# Patient Record
Sex: Female | Born: 1975 | Race: Black or African American | Hispanic: No | Smoking: Never smoker
Health system: Southern US, Community
[De-identification: ages and names within clinical notes are randomized; demographics above are authoritative.]

## PROBLEM LIST (undated history)

## (undated) DIAGNOSIS — D649 Anemia, unspecified: Secondary | ICD-10-CM

## (undated) DIAGNOSIS — R413 Other amnesia: Secondary | ICD-10-CM

## (undated) DIAGNOSIS — Z8679 Personal history of other diseases of the circulatory system: Secondary | ICD-10-CM

## (undated) DIAGNOSIS — I499 Cardiac arrhythmia, unspecified: Secondary | ICD-10-CM

## (undated) DIAGNOSIS — G43909 Migraine, unspecified, not intractable, without status migrainosus: Secondary | ICD-10-CM

## (undated) DIAGNOSIS — Z9109 Other allergy status, other than to drugs and biological substances: Secondary | ICD-10-CM

## (undated) DIAGNOSIS — Z9289 Personal history of other medical treatment: Secondary | ICD-10-CM

## (undated) DIAGNOSIS — R011 Cardiac murmur, unspecified: Secondary | ICD-10-CM

## (undated) HISTORY — DX: Morbid (severe) obesity due to excess calories: E66.01

## (undated) HISTORY — DX: Other amnesia: R41.3

## (undated) HISTORY — DX: Migraine, unspecified, not intractable, without status migrainosus: G43.909

## (undated) HISTORY — DX: Cardiac murmur, unspecified: R01.1

## (undated) HISTORY — DX: Personal history of other diseases of the circulatory system: Z86.79

---

## 2002-10-14 ENCOUNTER — Inpatient Hospital Stay (HOSPITAL_COMMUNITY): Admission: EM | Admit: 2002-10-14 | Discharge: 2002-10-16 | Payer: Self-pay | Admitting: Emergency Medicine

## 2002-10-14 ENCOUNTER — Emergency Department (HOSPITAL_COMMUNITY): Admission: EM | Admit: 2002-10-14 | Discharge: 2002-10-14 | Payer: Self-pay | Admitting: Emergency Medicine

## 2002-10-14 ENCOUNTER — Encounter (INDEPENDENT_AMBULATORY_CARE_PROVIDER_SITE_OTHER): Payer: Self-pay | Admitting: Cardiology

## 2002-10-14 ENCOUNTER — Encounter: Payer: Self-pay | Admitting: Emergency Medicine

## 2002-10-15 ENCOUNTER — Encounter: Payer: Self-pay | Admitting: Cardiology

## 2002-10-27 ENCOUNTER — Inpatient Hospital Stay (HOSPITAL_COMMUNITY): Admission: EM | Admit: 2002-10-27 | Discharge: 2002-11-10 | Payer: Self-pay

## 2002-10-27 ENCOUNTER — Encounter: Payer: Self-pay | Admitting: Cardiology

## 2002-10-28 HISTORY — PX: CYSTECTOMY: SHX5119

## 2002-10-31 ENCOUNTER — Encounter: Payer: Self-pay | Admitting: Thoracic Surgery

## 2002-11-01 ENCOUNTER — Encounter: Payer: Self-pay | Admitting: Cardiology

## 2002-11-01 ENCOUNTER — Encounter (INDEPENDENT_AMBULATORY_CARE_PROVIDER_SITE_OTHER): Payer: Self-pay | Admitting: *Deleted

## 2002-11-01 ENCOUNTER — Encounter: Payer: Self-pay | Admitting: Thoracic Surgery

## 2002-11-02 ENCOUNTER — Encounter: Payer: Self-pay | Admitting: Thoracic Surgery

## 2002-11-03 ENCOUNTER — Encounter: Payer: Self-pay | Admitting: Thoracic Surgery

## 2002-11-04 ENCOUNTER — Encounter: Payer: Self-pay | Admitting: Thoracic Surgery

## 2002-11-05 ENCOUNTER — Encounter: Payer: Self-pay | Admitting: Thoracic Surgery

## 2002-11-06 ENCOUNTER — Encounter: Payer: Self-pay | Admitting: Thoracic Surgery

## 2002-11-10 ENCOUNTER — Encounter: Payer: Self-pay | Admitting: Thoracic Surgery

## 2002-11-16 ENCOUNTER — Encounter: Admission: RE | Admit: 2002-11-16 | Discharge: 2002-11-16 | Payer: Self-pay | Admitting: Thoracic Surgery

## 2002-11-16 ENCOUNTER — Encounter: Payer: Self-pay | Admitting: Thoracic Surgery

## 2003-01-06 ENCOUNTER — Encounter: Admission: RE | Admit: 2003-01-06 | Discharge: 2003-01-06 | Payer: Self-pay | Admitting: Thoracic Surgery

## 2003-01-06 ENCOUNTER — Encounter: Payer: Self-pay | Admitting: Thoracic Surgery

## 2005-01-31 ENCOUNTER — Other Ambulatory Visit: Admission: RE | Admit: 2005-01-31 | Discharge: 2005-01-31 | Payer: Self-pay | Admitting: Family Medicine

## 2005-01-31 ENCOUNTER — Ambulatory Visit: Payer: Self-pay | Admitting: Family Medicine

## 2005-04-15 ENCOUNTER — Ambulatory Visit (HOSPITAL_COMMUNITY): Admission: RE | Admit: 2005-04-15 | Discharge: 2005-04-15 | Payer: Self-pay | Admitting: Obstetrics and Gynecology

## 2006-01-07 ENCOUNTER — Inpatient Hospital Stay (HOSPITAL_COMMUNITY): Admission: AD | Admit: 2006-01-07 | Discharge: 2006-01-10 | Payer: Self-pay | Admitting: Obstetrics and Gynecology

## 2006-01-07 ENCOUNTER — Encounter (INDEPENDENT_AMBULATORY_CARE_PROVIDER_SITE_OTHER): Payer: Self-pay | Admitting: *Deleted

## 2006-10-28 HISTORY — PX: DILATION AND CURETTAGE OF UTERUS: SHX78

## 2007-01-16 ENCOUNTER — Emergency Department (HOSPITAL_COMMUNITY): Admission: EM | Admit: 2007-01-16 | Discharge: 2007-01-16 | Payer: Self-pay | Admitting: Family Medicine

## 2007-08-30 ENCOUNTER — Inpatient Hospital Stay (HOSPITAL_COMMUNITY): Admission: AD | Admit: 2007-08-30 | Discharge: 2007-08-30 | Payer: Self-pay | Admitting: Obstetrics

## 2007-09-01 ENCOUNTER — Encounter (INDEPENDENT_AMBULATORY_CARE_PROVIDER_SITE_OTHER): Payer: Self-pay | Admitting: Obstetrics and Gynecology

## 2007-09-01 ENCOUNTER — Ambulatory Visit (HOSPITAL_COMMUNITY): Admission: RE | Admit: 2007-09-01 | Discharge: 2007-09-01 | Payer: Self-pay | Admitting: Obstetrics and Gynecology

## 2008-09-28 ENCOUNTER — Observation Stay (HOSPITAL_COMMUNITY): Admission: EM | Admit: 2008-09-28 | Discharge: 2008-09-29 | Payer: Self-pay | Admitting: Emergency Medicine

## 2008-09-29 ENCOUNTER — Encounter (INDEPENDENT_AMBULATORY_CARE_PROVIDER_SITE_OTHER): Payer: Self-pay | Admitting: Internal Medicine

## 2009-08-07 ENCOUNTER — Encounter: Admission: RE | Admit: 2009-08-07 | Discharge: 2009-08-07 | Payer: Self-pay | Admitting: Obstetrics and Gynecology

## 2010-11-18 ENCOUNTER — Encounter: Payer: Self-pay | Admitting: Surgery

## 2011-03-12 NOTE — Op Note (Signed)
[name], [name]            ACCOUNT NO.:  [id number]   MEDICAL RECORD NO.:  [id number]          PATIENT TYPE:  AMB   LOCATION:  SDC                           FACILITY:  [facility]   PHYSICIAN:  Lenoard Aden, M.D.DATE OF BIRTH:  12/31/1975   DATE OF PROCEDURE:  09/01/2007  DATE OF DISCHARGE:                               OPERATIVE REPORT   PREOPERATIVE DIAGNOSIS:  Missed abortion.   POSTOPERATIVE DIAGNOSIS:  Missed abortion.   PROCEDURE:  Suction dilatation and evacuation.   SURGEON:  Lenoard Aden, M.D.   ANESTHESIA:  MAC and paracervical.   ESTIMATED BLOOD LOSS:  Less than 50 mL.   COMPLICATIONS:  None.   DRAINS:  None.   COUNTS:  Correct.   The patient went to the recovery room in good condition.   Products of conception to pathology.   BRIEF OPERATIVE NOTE:  After being apprised of the risks of anesthesia,  infection, bleeding, possible risk of uterine perforation with possible  need for repair, injury to bowel and bladder with possible need for  repair, the patient was brought to the operating room, where she was  administered IV sedation without difficulty, prepped and draped in the  sterile fashion, catheterized until the bladder was empty.  After  achieving adequate anesthesia, dilute Xylocaine solution placed in a  standard paracervical block, 20 mL total.  The cervix is easily dilated  up to a #23 Pratt dilator, 7-mm suction curette placed.  Suction is  initiated, revealing products of conception.  Repeat curettage and  suction curettage done in a four-quadrant method reveals the cavity to  be empty.  Repeat suction confirms, good hemostasis noted.  All  instruments removed.  The patient tolerates the procedure well and is  transferred to recovery in good condition.      Lenoard Aden, M.D.  Electronically Signed    RJT/MEDQ  D:  09/01/2007  T:  09/02/2007  Job:  [id number]

## 2011-03-12 NOTE — H&P (Signed)
[name], [name]            ACCOUNT NO.:  [id number]   MEDICAL RECORD NO.:  [id number]           PATIENT TYPE:   LOCATION:                                 FACILITY:   PHYSICIAN:  [name], M.D.DATE OF BIRTH:  December 15, 1975   DATE OF ADMISSION:  09/28/2008  DATE OF DISCHARGE:                              HISTORY & PHYSICAL   The patient's PCP is Dr. Jeanmarie Hubert.   CHIEF COMPLAINT:  Chest pain.   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  She described it as a mild pressure in her chest.  Then, for the last  several days, it has been continuous to the point where she could not  take anymore, and she came into the emergency room for further  evaluation.  In the emergency room, the patient was given a dose of  nitroglycerin and some aspirin.  She said the nitroglycerin briefly  seemed to improve her pain but then returned back.  She described it as  currently a mild 2/10.  She says it is worse when she lies on her back,  and when she leans forward, it seems to improve it slightly.  She feels  that this pain is similar to when she had her previous cystic growth.  Because of her previous history, a CT scan of the chest with contrast  was done after a negative chest x-ray.  The CT scan of chest noted  surgical changes involving 6th right posterior rib from previous  thoracotomy, no obvious masses, but essentially unremarkable chest CT;  no signs of any spread of the cystic growth.   Currently, the patient is doing well.  She denies any headaches, vision  changes, dysphagia.  No palpitations, shortness of breath, wheezing, or  coughing.  No abdominal pain.  No  hematuria, dysuria, constipation,  diarrhea, focal extremity numbness, weakness, or pain.  Chest pain is as  described above.  Review of systems otherwise negative.   The patient's past medical history includes a history of benign cystic  growth leading to secondary atrial fibrillation, now resolved status  post thoracotomy.   MEDICATION:  She is on:  1. Multivitamin daily.  2. Phentermine as needed  3. P.r.n. antacids.   SHE HAS ALLERGIES TO TOBRAMYCIN EYE DROPS.   SOCIAL HISTORY:  No tobacco, alcohol or drug use.   FAMILY HISTORY:  Noted for mom who underwent bypass surgery in her 33s.   PHYSICAL EXAMINATION:  VITALS:  On admission, temperature 98.9, heart  rate 97 - now down to 91, blood pressure 148/93 - now down to 137/74,  respirations 16, O2 saturation 100% on 2 liters.  GENERAL:  She is alert and oriented x3 in no apparent distress.  HEENT:  Normocephalic, atraumatic.  Mucous membranes are moist.  She has  no carotid bruits.  HEART:  Regular rate and rhythm, S1-S2.  I am not able to reproduce her  chest pain with palpation.  Again, she complains of increased pain while  lying on her back, decreased when leaning forward.  LUNGS:  Clear to auscultation bilaterally.  ABDOMEN:  Soft, nontender, nondistended.  Positive bowel sounds.  EXTREMITIES:  Showed no clubbing, cyanosis or edema.   LABORATORY WORK:  Sodium 135, potassium 4.2, chloride 105, bicarb 22,  BUN 15, creatinine 0.9, glucose 82.  White count 9.1, H and H 13.1 and  41, MCV 81, platelet count 221.  INR is 1.1, PTT 27.  Urine pregnancy  test is negative.  CPK 63.1, MB less 1, troponin-I less than 0.05.   ASSESSMENT AND PLAN:  Chest pain, likely noncardiac, question secondary  to bronchospasm.  Will admit to telemetry, 24-hour observation.  Check 3  sets of cardiac markers, and if negative, then will also treat with  nebulizers and see if that improves her symptoms as I suspect this is  bronchospasm.       [name], M.D.  Electronically Signed     SKK/MEDQ  D:  09/28/2008  T:  09/28/2008  Job:  [id number]

## 2011-03-15 NOTE — Op Note (Signed)
   NAMESWETA, HALSETH                         ACCOUNT NO.:  [id number]   MEDICAL RECORD NO.:  [id number]                   PATIENT TYPE:  INP   LOCATION:  2550                                 FACILITY:  [facility]   PHYSICIAN:  Kaylyn Layer. Michelle Piper, M.D.                DATE OF BIRTH:  01-23-1976   DATE OF PROCEDURE:  11/01/2002  DATE OF DISCHARGE:                                 OPERATIVE REPORT   PROCEDURE PERFORMED:  Epidural catheter placement for postoperative pain  relief.   INDICATIONS FOR PROCEDURE:  I was consulted by Dr. Dewayne Shorter to place an  epidural catheter in the patient for postoperative pain relief.  I had the  opportunity to discuss the procedure and risks and benefits with the patient  preoperatively and she agreed to the catheter placement.   DESCRIPTION OF PROCEDURE:  At the end of the procedure, prior to emergence  from anesthesia, the patient was left in the left lateral decubitus  position.  Her back was prepped with Betadine.  Using a #17 Tuohy needle,  the epidural space was easily cannulated at the L1-2 interspace at the  midline using the loss of resistance technique.  The catheter was then  threaded 5 cm in to the epidural space and the needle removed.  An initial  dose of 10 cc of 0.5% Xylocaine was incrementally injected.  The catheter  was firmly fixed to the patient's back.  She was turned supine, extubated  and taken to recovery in stable condition.   In the post anesthesia care unit a fentanyl infusion was begun and she will  be followed on the floor by the anesthesiology service.                                               Kaylyn Layer Michelle Piper, M.D.    KDO/MEDQ  D:  11/01/2002  T:  11/01/2002  Job:  [id number]

## 2011-03-15 NOTE — Discharge Summary (Signed)
[name], [name]                         ACCOUNT NO.:  [id number]   MEDICAL RECORD NO.:  [id number]                   PATIENT TYPE:  INP   LOCATION:  2029                                 FACILITY:  [facility]   PHYSICIAN:  Cristy Hilts. Jacinto Halim, M.D.                  DATE OF BIRTH:  10-24-1976   DATE OF ADMISSION:  DATE OF DISCHARGE:  11/10/2002                                 DISCHARGE SUMMARY   ADMISSION DIAGNOSES:  1. Bronchogenic cyst.  2. New-onset atrial fibrillation with rapid ventricular response.  3. Low grade fever.   DISCHARGE DIAGNOSES:  1. Bronchogenic cyst.  2. New-onset atrial fibrillation with rapid ventricular response.  3. Low grade fever.  4. Status post right video-assisted thoracoscopic surgery, thoracotomy,     resection of bronchogenic cyst on 11/11/01 by Dr. Christella Noa.  5. Mild anemia.  6. Positive Clostridium difficile with diarrhea, treated with Flagyl.  7. Anticoagulation:  The patient had been refusing Coumadin throughout the     hospitalization but finally by the time of discharge home agreed to     starting Coumadin anticoagulation.   HISTORY OF PRESENT ILLNESS:  [name] is a 35 year old African  American female who was recently diagnosed with a bronchogenic cyst at a  recent hospitalization dated 10/14/02 through 10/16/02.  At that time, she  was evaluated by Dr. Edwyna Shell, who felt that she had a large bronchogenic cyst  with left atrial compression and mediastinal component.  They discussed  surgical procedures.  It was decided that she would go home for the holidays  and come back around January 5th or 6th to undergo thoracotomy of the cyst.   In the meantime on 10/27/02, she had gone to Dr. Scheryl Darter office in  preparation for her surgery on the upcoming Monday.  At that time, she was  found to be in atrial fibrillation with rapid ventricular response.  As  well, she did have a cough for about a week with no sputum production but  did have a fever.  She had not experienced chills or diaphoresis but did  admit to feeling an increased heart rate for about a week but denied any  chest pain.   At that time, her temperature orally was 100.3.  Her heart rate was in the  160's and blood pressure was stable at 128/71.   At that time, she was seen and evaluated by Dr. Gaspar Garbe B. Little, who was on  call for Dr. Cristy Hilts. Ganji.  It was felt that she had new-onset atrial  fibrillation with rapid ventricular response.  They planned for admission to  telemetry, give IV Cardizem bolus then drip, IV heparin per pharmacy  protocol and empiric IV antibiotics.  Dr. Edwyna Shell would follow up in regards  to the bronchogenic cyst.   HOSPITAL COURSE:  On 10/28/02, digoxin was added for further weight control.  It was  felt that we may need to try an antiarrhythmic if no response to  Cardizem or digoxin combination.   On 10/29/02, procainamide was added.  Antibiotics were adjusted over the  next days.  Cardiovascular Thoracic Surgery was following and planned for  surgery on the fifth.   On 11/01/02, Ms. Rodgers underwent right video-assisted thoracoscopy/right  thoracotomy/resection of bronchogenic cyst by Dr. Dewayne Shorter.  She had no  complications.   Please note that just prior to the surgery, Dr. Pearletha Furl. Alanda Amass had  changed the Pronestyl to IV for better perioperative control.  On 11/01/02,  she was continued on the Pronestyl drip.   By 11/02/02, she was in sinus rhythm on IV procainamide, Cardizem and  digoxin.  At that point, we were planning to discontinue the IV Pronestyl  and change to p.o. Procanbid 1 gram twice daily for eight to twelve weeks or  paroxysmal atrial fibrillation.  As well at that time, it was felt that she  probably needed Coumadin for eight to twelve weeks but at that time, she was  reluctant to start Coumadin.   She continued to maintain sinus rhythm on January 7th and 8th with  procainamide.  However,  on January 9th which is postoperative day #4, she  had recurrent atrial fibrillation.  At that time, the Cardizem was added  back for further rate control and  Procanbid was increased to 1500 mg q.12h.  We again discussed the need for Coumadin but she was reluctant.   On 11/06/02, she developed some diarrhea.  Subsequently Clostridium  difficile was positive and she was started on Flagyl.   She continued to be in atrial fibrillation on January 11th and continued in  atrial fibrillation again on the twelfth.  However, on the morning of 01/13,  she did convert back to sinus rhythm.  She maintained sinus rhythm on  January 13th and January 14th up until her discharge home.   Finally by January 14th, she is deemed stable for discharge home both by  Cardiovascular Thoracic Surgery, Dr. Edwyna Shell as well as by Dr. Madaline Savage.   At this point, she feels sluggish with decreased energy.  She also related  a problem with history of noncompliance with medications and wanting to be  off as many medicine as possible.   At this point, she is in sinus bradycardia at rate 67 and seems to be  symptomatic and Dr. Elsie Lincoln plans to stop her metoprolol and digoxin prior to  her discharge home because of the symptomatic bradycardia.  She was finally  agreeable to take Coumadin at least for six to eight weeks.  Because she has  maintained sinus rhythm for almost forty-eight hours now or for about thirty  hours now, Dr. Elsie Lincoln feels that it is safe for her to go home on Coumadin  with no cross over.  At this point, she is deemed stable for discharge home.   HOSPITAL CONSULTATION:  Cardiovascular thoracic surgery consultation by Dr.  Dewayne Shorter for evaluation of bronchogenic cyst.   HOSPITAL PROCEDURES:  1. Right video-assisted thoracoscopy/right thoracotomy/resection of     bronchogenic cyst performed by Dr. Dewayne Shorter on 11/01/01. 2. EKG on admission showed atrial fibrillation with rapid  ventricular     response of about 130's to 180's.  3. Telemetry:  She was in atrial fibrillation from 10/27/02 through     11/02/02.  She had some intermittent sinus rhythm on 11/03/02 and     11/04/02.  She  went back to atrial fibrillation on 11/05/02, 11/06/02,     11/07/02 and 11/08/02.  She converted back to sinus rhythm on 11/09/02     and maintained sinus rhythm until her discharge home on 11/10/02.   RADIOLOGY:  Chest x-ray 11/02/02 showed mild left basilar atelectasis.  Chest x-ray 11/04/02 showed stable tiny right apical pneumothorax and slight  increased bibasilar atelectasis.  Chest x-ray 11/06/02 showed central line  removal, otherwise stable appearance.  Chest x-ray 11/05/02 showed  resolution of previously seen apical pneumothorax.   LABORATORY DATA:  On both 10/31/02 and 11/05/02, procainamide, N-acetyl  procainamide and Procanbid levels were checked and were low at all times.  On 10/29/02, sodium 138, potassium 4.5, glucose 101, blood urea nitrogen 5,  creatinine 0.9.  At discharge, sodium 139, potassium 4.0, blood urea  nitrogen 6, creatinine 0.9, glucose 84.  International normalized ratio was  1.4 but prothrombin times were elevated secondary to heparin.  Coumadin was  not begun until the time of her discharge home.  On admission, white count  9.4, hemoglobin 11.4, hematocrit 34.6, platelets 276.  At discharge, white  count 5.2, hemoglobin 10.3, hematocrit 31.3, platelets 270.   DISCHARGE MEDICATIONS:  1. Coumadin 5 mg once a day.  2. Procanbid 1000 mg tablet, two tablets twice a day for 2000 mg twice a     day.  3. Flagyl 500 mg three times a day for eight more days.  4. Multivitamin.   FOLLOW UP:  She will have blood drawn to check her pro-time and  international normalized ratio Monday, January 19th.  She will follow up  with Dr. Verl Dicker office for instructions.  Follow up with Dr. Jacinto Halim on  January 27th at 9:30 a.m.  Follow up with Dr. Edwyna Shell on January 20th  at 4  o'clock p.m.  She is to have a chest x-[facility] at [facility]  one hour prior to the appointment and bring her films to Dr. Edwyna Shell.     Mary B. Remer Macho, P.A.-C.                   Cristy Hilts. Jacinto Halim, M.D.    MBE/MEDQ  D:  11/10/2002  T:  11/10/2002  Job:  [id number]   cc:   Angelena Sole, M.D. [facility]  [address]  [place]  [place] [address]  Fax: [phone]   Ines Bloomer, M.D.  [address]  [place]  [place] [address]  Fax: [phone]

## 2011-03-15 NOTE — Op Note (Signed)
NAMEKAYTI, POSS                         ACCOUNT NO.:  [id number]   MEDICAL RECORD NO.:  [id number]                   PATIENT TYPE:  INP   LOCATION:  2002                                 FACILITY:  [facility]   PHYSICIAN:  Ines Bloomer, M.D.              DATE OF BIRTH:  1976/08/16   DATE OF PROCEDURE:  DATE OF DISCHARGE:                                 OPERATIVE REPORT   PREOPERATIVE DIAGNOSES:  1. 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  2. Atrial fibrillation.   POSTOPERATIVE DIAGNOSES:  1. 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  2. Atrial fibrillation.   OPERATION PERFORMED:  Right VATS and right thoracotomy resection of  bronchogenic cyst.   SURGEON:  Dr. Patricia Nettle. Burney.   FIRST ASSISTANT:  Mr. Cherlyn Cushing, Alver Sorrow, P.A.-C.  and Maxwell Marion, R.N.   DESCRIPTION OF PROCEDURE:  After general anesthesia, the patient was turned  in the right lateral thoracotomy position.  She was prepped and draped in  the usual sterile manner.  A dual lumen tube was inserted.  The right lung  was deflated.  Two trochar sites remained in the anterior and posterior  axillary seventh intercostal space.  Two trochars were inserted.  A  ____________ scope was inserted in the bronchogenic cyst which was enormous  and seen in the subcarinal area.  Pictures were taken of this.  A 6-7 cm  incision was made over the fifth intercostal space.  The latissimus was  divided and the serratus was reflected anteriorly.  Portions of the sixth  rib was taken subperiosteally at the angle and _______________ replaced at  right angles.  Dissection was started taking down the inferior pulmonary  ligament and then dissecting cyst off the inferior pulmonary vein and then  dissecting it laterally off the esophagus.  _____________ and superiorly up  to the carina.  It was anteriorly involved at the right main stem bronchus  and was dissected off the right main stem  bronchus and off the right lung.  It was intimately involved with the posterior pericardium and had to be  resected off the pericardium and off the left atrium, and along the area  posterior to the heart where the transverse sinus was.  It was dissected  down to the posterior area.  It was then __________ with the atrium.  It was  then dissected  superiorly and was dissected off the left main stem  bronchus.  It was opened and approximately 400-500 cubic centimeters was  evacuated and sent for culture.  The dissection was very tedious.  After it  all had been dissected off the carina and dissected off the esophagus, there  was just a little left on the atrium and this was cauterized with  electrocautery.  Two chest tubes were brought in.  An anterior chest tube  was brought in and tied in place.  A posterior chest tube  was brought in  through the trochar site and put in the subcarinal space.  He was checked  for air leak and none found.  Marcaine block was done in the usual fashion.  The  chest was closed with 4-0 pericostals and the ON-Q was inserted.  Catheters  were inserted medially and laterally and sutured in place with 3-0 silk.  The muscle layer was closed with #1 Vicryl, the subcutaneous tissue with 2-0  Vicryl and 3-0 Vicryl in subcuticular stitch.  The patient was taken to the  recovery room in stable condition.                                                Ines Bloomer, M.D.    DPB/MEDQ  D:  11/01/2002  T:  11/01/2002  Job:  [id number]   cc:   Madaline Savage, M.D.  [address]  [place]  [place] [address]  Fax: [phone]

## 2011-03-15 NOTE — Discharge Summary (Signed)
[name], [name]                         ACCOUNT NO.:  [id number]   MEDICAL RECORD NO.:  [id number]                   PATIENT TYPE:  INP   LOCATION:  4741                                 FACILITY:  [facility]   PHYSICIAN:  Cristy Hilts. Jacinto Halim, M.D.                  DATE OF BIRTH:  [birth year]   DATE OF ADMISSION:  10/14/2002  DATE OF DISCHARGE:  10/16/2002                                 DISCHARGE SUMMARY   HISTORY OF PRESENT ILLNESS:  [name] is a 35 year old African-  American female, patient of Dr. Verl Dicker. She had been seen in the office on  10/07/02 with complaints of chest pain, which was atypical and pleuritic.  She had apparently an abnormal cardiac exam, suggestive of mitral stenosis,  and an abnormal chest x-ray. She was sent for echocardiogram as an  outpatient. Then, apparently, she went to [place] ER at 2 a.m. on the morning  of 10/14/02 with complaints of chest pressure. She was given a GI cocktail  with improvement of her symptoms. She had a gallbladder ultrasound done,  results unknown. Then, she apparently was discharged from the emergency  room. She had a 2-D echocardiogram at our office on 10/14/02 which showed  atrial mass versus an extracardial mass, so she was asked to go the [facility] emergency room for TEE and possible admission. Dr. Jacinto Halim did a TEE  which showed a 7-cm globular mass, probably extracardiac, compressing the  left atrial with an echo dense mass, appearing homogenous. Apparently, the  patient has been having symptoms of difficulty swallowing and shortness of  breath along with her chest pain. The case was discussed by Dr. Alanda Amass  with Dr. Charlton Haws. It was decided that she should undergo a MRI of her  heart. This was performed, and she was noted to have a large bronchogenic  cyst. Thus, CVTS was called with Dr. Edwyna Shell. Dr. Edwyna Shell reviewed the MRI.  His impression was that she had a large bronchogenic cyst with LA  compression and  mediastinal component. He discussed with the patient  removing the cyst by thoracotomy versus VATS. Dr. Edwyna Shell again saw her on  10/16/02, rediscussed the surgical procedure and the timing. It was decided  that she could go home for the holidays and that on January 5 or 6 she would  undergo a thoracotomy to remove the cyst. Because of her occasional severe  chest pain, she was given a prescription for Percocet at discharge. She was  not having any pain at the time of discharge.   LABORATORY DATA:  On 10/15/02, sodium 138, potassium 4.3, glucose 91, BUN  11, creatinine 0.9. Hemoglobin 12.8, hematocrit 37.5, WBC 8.6, and platelets  209. Amylase and lipase were within normal limits. Urine was negative for  any pathology. Abdominal ultrasound results from 10/14/02 showed negative  for any pathology.   DISCHARGE MEDICATIONS:  1. Percocet 10/650 one  or two every four to six hours for pain.  2. Protonix 40 mg one per day as needed for indigestion.   ACTIVITY:  To do no strenuous activities.   FOLLOW UP:  Dr. Scheryl Darter office will call her for appointment with a surgery  date and time and any presurgical blood work or physical assessment that  needs to be done.    DISCHARGE DIAGNOSES:  1. Chest pain, severe at times.  2. Bronchogenic cyst, large, causing chest pain and other symptoms.     [name], N.P.                        Cristy Hilts. Jacinto Halim, M.D.    BB/MEDQ  D:  10/16/2002  T:  10/18/2002  Job:  [id number]   cc:   Ines Bloomer, M.D.  [address]  [place]  [place] [address]  Fax: [phone]  [address]  [place]  [place] [address]  Fax: [phone]   Angelena Sole, M.D. [facility]

## 2011-03-15 NOTE — Discharge Summary (Signed)
   NAMEJAZALYN, [name]                         ACCOUNT NO.:  [id number]   MEDICAL RECORD NO.:  [id number]                   PATIENT TYPE:  INP   LOCATION:  2029                                 FACILITY:  [facility]   PHYSICIAN:  Cristy Hilts. Jacinto Halim, M.D.                  DATE OF BIRTH:  08-11-1976   DATE OF ADMISSION:  10/27/2002  DATE OF DISCHARGE:                                 DISCHARGE SUMMARY   ADDENDUM:  Addendum to dictation [phone]   After review with the patient, she has decided that she would rather follow  up with Dr. Elsie Lincoln rather than Dr. Jacinto Halim. Therefore, this appointment is  made with Dr. Elsie Lincoln, Monday, 1/26, on 3:30 p.m. She was given Percocet to  take one to two every four to six hours as needed for pain. She is given #20  with one refill.     Mary B. Remer Macho, P.A.-C.                   Cristy Hilts. Jacinto Halim, M.D.    MBE/MEDQ  D:  11/10/2002  T:  11/10/2002  Job:  [id number]

## 2011-03-15 NOTE — H&P (Signed)
[name], [name]                         ACCOUNT NO.:  [id number]   MEDICAL RECORD NO.:  [id number]                   PATIENT TYPE:  INP   LOCATION:  2002                                 FACILITY:  [facility]   PHYSICIAN:  Thereasa Solo. Little, M.D.              DATE OF BIRTH:  07-Jun-1976   DATE OF ADMISSION:  10/27/2002  DATE OF DISCHARGE:                                HISTORY & PHYSICAL   CHIEF COMPLAINT:  Atrial fibrillation with a rapid ventricular response.   HISTORY OF THE PRESENT ILLNESS:  This is a 35 year old black female patient  of Dr. Vonna Kotyk R. 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  At Dr. Roylene Reason Burney's  office today, in preparation for surgery on Monday, she was found to be in  atrial fibrillation with rapid ventricular response.  She has had a cough  for about a week without sputum production, but she also has had a fever.  She has not had chills or diaphoresis, though she does admit that she could  feel an increased heart rate for about a week, but she has had no chest  pain.   PAST MEDICAL HISTORY:  The patient's past medical history is significant for  bronchogenic cyst with left atrial compression, new onset of atrial  fibrillation with rapid ventricular response and atypical chest pain.   PAST SURGICAL HISTORY:  The patient denies any past surgical history and  denies any past fractures.   DRUG ALLERGIES:  The patient has no known drug allergies.   MEDICATIONS:  1. The patient admits to taking Percocet 10/650 mg one or two every four to     six hours for the pain from her bronchogenic cyst.  2. She takes one multivitamin every day.  3. She was discharged from the hospital earlier this month on Nexium 40 mg     one every day but she does not take that now.   FAMILY HISTORY:  The patient's father is 48 and has a history of  palpitations.  Her mother is 37 and has diabetes mellitus and  some form of  heart disease.  She has one brother with diabetes mellitus and leukemia and  one sister with thyroid disease.  She has two brothers who are alive and  well.   SOCIAL HISTORY:  The patient is single.  She works at [employer].  She does  not smoke.  She does use rare alcohol.  She does not use caffeine.  She does  not abuse drugs.  She is right-handed.  She has recently been trying to  follow the [employer].   REVIEW OF SYSTEMS:  CONSTITUTIONAL:  The patient denies fevers, chills,  sweating or edema.  Her weight is about the same as usual.  She does have  some problem with sleeping due to the chest pain and  Percocet has helped  that.  EYES:  The patient denies diplopia, blurring, glasses, contacts,  glaucoma or cataracts.  EARS, NOSE, MOUTH AND THROAT:  The patient denies  deafness, tinnitus, rhinorrhea, sneezing, dysgeusia, sores in her mouth or  dentures.  CARDIOVASCULAR:  The patient does have some chest pain due to the  bronchogenic cyst that she has in her chest.  She also has had some  palpitations and racing heart rate, difficulty breathing, orthopnea and  denies claudication.  RESPIRATORY:  The patient has had a cough without  sputum production or wheezing.  She does not smoke but she states that she  does snore.  GASTROINTESTINAL:  The patient has had difficulty swallowing  due to the cyst in her chest but has not had nausea, vomiting, indigestion,  heartburn, diarrhea or constipation.  GENITOURINARY:  The patient denies  dysuria, pyuria, hematuria, anuria, hesitation, frequency or nocturia.  Her  last menstrual period was three weeks ago.  MUSCULOSKELETAL:  The patient  denies painful joints, myalgias or weakness.  She has a steady gait and she  has had no recent falls.  SKIN:  The patient does have a rash from EKG  electrodes at her last admission but no other skin problems.  BREASTS:  The  patient denies masses, lumps, tenderness or discharge of the breasts.   NEUROLOGIC:  The patient denies faintness, syncope, dizziness, numbness,  seizures, the signs and symptoms of a stroke or headaches.  PSYCHIATRIC:  The patient denies depression, tension, stress, anorexia or hallucinations.  ENDOCRINE:  The patient denies thyroid disease, diabetes mellitus, excessive  thirst, excessive hunger or excessive urine volume output.  HEMATOLOGIC:  The patient denies that she bruises or bleeds easily.  LYMPHATIC:  The  patient denies adenopathy of the neck, axillae or groin.  ALLERGIC:      The  patient has no known drug allergies and seems to be only allergic to EKG  electrodes.  ALL OTHER SYSTEMS:  All other systems are negative.   PHYSICAL EXAMINATION:  VITAL SIGNS:  The patient's temperature orally is  100.3 degrees Fahrenheit, her pulse averages at about 160, ranging from  about 130 to 180, her respirations are 18, her blood pressure 128/71, her  weight about 188 pounds, her height 5 feet 10 inches.  SAO2 showed 99% on  room air.  Her age is 35.  GENERAL:  The patient is a well-developed, well-nourished black female in no  acute distress.  PSYCHIATRIC:  The patient is pleasant, cooperative and responds  appropriately.  HEENT:  The patient is normocephalic and atraumatic.  Her pupils equal,  round and reactive to light and accommodate and are 2 mm in diameter.  Extraocular movements are intact.  Her mouth is moist.  Her oropharynx is  benign.  NECK:  The patient's neck is supple with a midline trachea.  She is without  jugular venous distention, bruit, thyromegaly or hepatojugular reflux.  CHEST:  The patient's chest is eupneic and clear to percussion.  Breath  sounds are decreased in her bilateral bases.  BREASTS:  The patient's breasts are of normal contour for her weight without  discharge or tenderness.  ADENOPATHY:  The patient is without cervical adenopathy. CARDIAC:  The patient has an irregular rate and rhythm with tachycardia.  S1  and S2 can be  heard.  She is without murmurs, gallops, rubs or clicks  appreciated.  ABDOMEN:  The patient has positive bowel sounds and a soft abdomen.  She is  not tender, she is not distended and she is not obese.  GENITOURINARY:  The patient's last menstrual period was three weeks ago.  She is not tender over her bladder.  EXTREMITIES:  The patient moves all extremities x4.  Her strength is 5/5 in  her upper and lower extremities.  She is without ankle edema.  SKIN:  The patient's skin is warm and dry without jaundice, cyanosis, pallor  or rashes.  She has a brisk capillary refill.  NEUROLOGIC:  The patient is conscious, alert and oriented to person, place,  time and situation.  Cranial nerves II-XII are grossly intact.   LABORATORY DATA AND X-RAY:  The patient's telemetry showed atrial  fibrillation with a rate between 130 to 180.   Her chest x-ray shows cardiomegaly with a bronchogenic cyst superior to the  heart with no acute disease.   Laboratories:  The patient's UA was abnormal only for amber color, cloudy  with moderate bilirubin, ketones were 15 and urobilinogen was high at 2.0.  Her blood type is AB-negative, Rh-positive.  Her PTT was high at 38, her PT  was 16.0, her INR was 1.3.  Her sodium was 132, her potassium 4.0, her  chloride 107, her CO2 21, her BUN 8, her creatinine 0.9, her blood glucose  142, her albumin 3.2, which was somewhat low; her white blood cell count  10.8, her hemoglobin 12.9, her hematocrit 40.0, her platelets 251,000, her  RDW 15.4.  On ABG, her pH was 7.416, her PCO2 was 32.4, her PO2 was 85.1,  her bicarb 20.5, her TCO2 was 21.5, her base deficit was 3.3, her O2  saturation was 96.5.    IMPRESSION:  1. New onset of atrial fibrillation with rapid ventricular response probably     of one-week duration, according to the patient's report.  2. Low-grade temperature with cough, not productive.  3. Bronchogenic cyst with compression of the left atrium.   PLAN:   1. Admit to telemetry.  2. Cardizem drip IV at 10 mg with a 20 mg bolus.  3. IV heparin by pharmacy protocol.  4. IV antibiotic with Levaquin.  5. Pain control.  6. Chest x-ray.     Arletha Pili. Penni Bombard, N.P.                   Thereasa Solo. Little, M.D.    LMK/MEDQ  D:  10/27/2002  T:  10/28/2002  Job:  [id number]

## 2011-03-15 NOTE — H&P (Signed)
[name], [name] NO.:  [id number]   MEDICAL RECORD NO.:  [id number]                   PATIENT TYPE:  INP   LOCATION:  1829                                 FACILITY:  [facility]   PHYSICIAN:  Ines Bloomer, M.D.              DATE OF BIRTH:  1975-12-28   DATE OF ADMISSION:  10/27/2002  DATE OF DISCHARGE:                                HISTORY & PHYSICAL   ATTENDING SURGEON:  Ines Bloomer, M.D.   CARDIOLOGIST:  Cristy Hilts. Jacinto Halim, M.D.   HISTORY OF PRESENT ILLNESS:  The patient is a 35 year old female referred by  Dr. Jacinto Halim for evaluation of a 10.2 x 10 x 8.5 cm mass causing mass effect to  the pulmonary arteries particularly the right pulmonary artery and also  deformity of the left atrium.  It is suggestive of a large bronchogenic  cyst.  She has had two weeks symptoms of chest pain which initially brought  her to medical attention, and now for the last week she has had increasing  shortness of breath, possibly dysrhythmia, increasing cough and was found  today on preadmission testing to be in atrial fibrillation with rapid  ventricular rate with blood pressure 108 systolic, somewhat fatigued with a  persistent dry cough.   PAST MEDICAL HISTORY:  Noncontributory.  She denies any prior history of  diabetes, myocardial infarction, peptic ulcer disease, cerebrovascular  accident, GI bleed, GERD, pulmonary embolism, deep venous thrombosis,  cardiac dysrhythmias, coronary artery disease, pneumonia and excessive  cough.   MEDICATIONS:  She takes no medications on a regular basis.  Currently she is  taking Percocet one to two tablets q.4-6h. p.r.n. pain for her chest pain.   ALLERGIES:  She has no known drug allergies.   FAMILY HISTORY:  Her mother has a history of coronary artery disease and  diabetes.  She has one brother with coronary artery disease and diabetes.   SOCIAL HISTORY:  She is single.  She has no children.  She works as a Best boy for [employer]. She does not partake of  alcoholic beverages or tobacco.   PHYSICAL EXAMINATION:  GENERAL:  This is an alert, oriented female with  persistent dry cough.  VITAL SIGNS:  She was found to have a fever of 100 degrees.  Preadmitting  blood pressure 108/80 in the left upper extremity.  Pulse rapid at 137.  Electrocardiogram shows atrial fibrillation and rapid ventricular rate.  HEENT:  Eyes: Pupils are equal, round and reactive to light.  Extraocular  movements are intact.  NECK:  Supple.  No jugular venous distention.  No carotid bruits.  LUNGS:  Relatively clear bilaterally.  HEART:  Irregular rhythm, rapid, no murmurs, rubs or gallops.  ABDOMEN:  Soft, nontender, nondistended.  Bowel sounds are present.  EXTREMITIES:  No evidence of clubbing, cyanosis, ulcerations.  Palpable  pulses radial 4/4 bilaterally.  Palpable dorsalis  pedis pulse bilaterally.  NEUROLOGICAL:  No neurological deficits.    IMPRESSION:  Bronchogenic cyst.   PLAN:  Resection of bronchogenic cyst November 01, 2002.     Maple Mirza, P.A.                    Ines Bloomer, M.D.    GM/MEDQ  D:  10/27/2002  T:  10/27/2002  Job:  [id number]

## 2011-03-15 NOTE — Op Note (Signed)
[name], [name]            ACCOUNT NO.:  [id number]   MEDICAL RECORD NO.:  [id number]          PATIENT TYPE:  INP   LOCATION:  9140                          [facility]ILITY[facility]   PHYSICIAN:  Lenoard Aden, M.D.DATE OF BIRTH:  [birth year]   DATE OF PROCEDURE:  01/07/2006  DATE OF DISCHARGE:                                 OPERATIVE REPORT   PREOPERATIVE DIAGNOSIS:  A 35-6/7 weeks twin, dichorionic-diamniotic  intrauterine pregnancy with mild preeclampsia, breech, now presenting twin  A.   POSTOPERATIVE DIAGNOSIS:  A 35-6/7 weeks twin, dichorionic-diamniotic  intrauterine pregnancy with mild preeclampsia, breech, now presenting twin  A.   OPERATION/PROCEDURE:  Low segment transverse cesarean section.   SURGEON:  Lenoard Aden, M.D.   ANESTHESIA:  Spinal by Burnett Corrente, M.D.   ESTIMATED BLOOD LOSS:  1500 mL.   COMPLICATIONS:  None.   DRAINS:  Foley.   COUNTS:  Correct.   CONDITION:  The patient to recovery in good condition.   DESCRIPTION OF PROCEDURE:  After being apprised of the risks of anesthesia,  infection, bleeding, injury to abdominal organs and need for repair, delayed  versus immediate complications to include bowel and bladder injury noted,  the patient is brought to the operating room.  She is administered spinal  anesthetic without complications, prepped and draped in the usual sterile  fashion.  Foley catheter previously placed.  After achieving adequate  regional anesthesia, Pfannenstiel skin incision made with the scalpel,  carried down to the fascia which is nicked in the midline and opened  transversely using Mayo scissors.  Rectus muscles dissected sharply in the  midline.  Peritoneum entered sharply and bladder blade placed.  Visceral  peritoneum scored in the smiley fashion fashion and dissected sharply off  the lower uterine segment.  Curved hysterotomy incision made.  Amniotomy  with clear fluid.  Atraumatic delivery of frank breech  presenting twin A  from atraumatic delivery of twin A, breech in the usual standard fashion.  Bulb suction performed.  Cord blood collected.  Amniotomy with clear fluid.  Vertex presenting B delivered without difficulty.  Both Apgars are 8 and 9.  A is 7 pounds, B is 5 pounds 7 ounces.  At this time the placenta is  delivered manually intact.  Three-vessel cord noted.  Uterus exteriorized,  curetted using dry lap pack and closed in two layers using 0 Monocryl  suture, second imbricating layer placed.  Good hemostasis noted.  Bladder  flap was inspected and found to be hemostatic. Irrigation was accomplished.  The fascia was then closed using 0 Monocryl with continuous running suture.  Skin closed using staples.  Ovaries and tubes were previously palpably  normal.  The patient tolerated the procedure well and was transferred to the  recovery room in good condition.      Lenoard Aden, M.D.  Electronically Signed     RJT/MEDQ  D:  01/07/2006  T:  01/08/2006  Job:  [id number]

## 2011-03-15 NOTE — Discharge Summary (Signed)
[name], [name]            ACCOUNT NO.:  [id number]   MEDICAL RECORD NO.:  [id number]          PATIENT TYPE:  INP   LOCATION:  9140                          [facility]   PHYSICIAN:  Lenoard Aden, M.D.DATE OF BIRTH:  [birth year]   DATE OF ADMISSION:  01/07/2006  DATE OF DISCHARGE:  01/10/2006                                 DISCHARGE SUMMARY   ADMISSION DIAGNOSIS:  Thirty-four week twins with mild preeclampsia.   DISCHARGE DIAGNOSES:  1.  Thirty-four week twins with mild preeclampsia.  2.  Anemia.   PROCEDURE:  Primary low segment transverse cesarean section, twin gestation,  no complications, on January 07, 2006.   Postoperative course uncomplicated.  Discharged to home day #3.  Blood  pressure stable.  Hemoglobin stable.  Discharge teaching done.   DISCHARGE MEDICATIONS:  1.  Iron.  2.  Prenatal vitamins.  3.  Tylox.   Follow up in the office in four to six weeks.      Lenoard Aden, M.D.  Electronically Signed     RJT/MEDQ  D:  02/19/2006  T:  02/20/2006  Job:  [id number]

## 2011-08-01 LAB — BASIC METABOLIC PANEL
BUN: 15 mg/dL (ref 6–23)
CO2: 22 mEq/L (ref 19–32)
Calcium: 8.9 mg/dL (ref 8.4–10.5)
Chloride: 105 mEq/L (ref 96–112)
Creatinine, Ser: 0.94 mg/dL (ref 0.4–1.2)
GFR calc Af Amer: >60 mL/min (ref >60)
GFR calc non Af Amer: >60 mL/min (ref >60)
Glucose, Bld: 82 mg/dL (ref 70–99)
Potassium: 4.2 mEq/L (ref 3.5–5.1)

## 2011-08-01 LAB — LIPID PANEL
HDL: 65 mg/dL (ref >39)
LDL Cholesterol: 96 mg/dL (ref 0–99)
Total CHOL/HDL Ratio: 2.8 RATIO

## 2011-08-01 LAB — CARDIAC PANEL(CRET KIN+CKTOT+MB+TROPI)
Relative Index: 0.5 (ref 0.0–2.5)
Total CK: 106 U/L (ref 7–177)
Total CK: 134 U/L (ref 7–177)
Troponin I: 0.01 ng/mL (ref 0.00–0.06)

## 2011-08-01 LAB — POCT CARDIAC MARKERS
CKMB, poc: <1 ng/mL — ABNORMAL LOW (ref 1.0–8.0)
Myoglobin, poc: 63.1 ng/mL (ref 12–200)

## 2011-08-01 LAB — CBC
Hemoglobin: 13.4 g/dL (ref 12.0–15.0)
MCHC: 32.6 g/dL (ref 30.0–36.0)
MCV: 81.3 fL (ref 78.0–100.0)
RBC: 5.04 MIL/uL (ref 3.87–5.11)

## 2011-08-01 LAB — POCT PREGNANCY, URINE: Preg Test, Ur: NEGATIVE

## 2011-08-01 LAB — PROTIME-INR: INR: 1.1 (ref 0.00–1.49)

## 2011-08-06 LAB — POCT PREGNANCY, URINE
Operator id: 117411
Operator id: 26329
Preg Test, Ur: POSITIVE
Preg Test, Ur: POSITIVE

## 2011-08-06 LAB — URINALYSIS, ROUTINE W REFLEX MICROSCOPIC
Bilirubin Urine: NEGATIVE
Glucose, UA: NEGATIVE
Ketones, ur: NEGATIVE
Nitrite: NEGATIVE
Protein, ur: NEGATIVE
pH: 6

## 2011-08-06 LAB — CBC
HCT: 38
Hemoglobin: 12.5
Platelets: 248
RBC: 4.89
RDW: 17.5 — ABNORMAL HIGH
WBC: 10.6 — ABNORMAL HIGH

## 2011-08-06 LAB — URINE MICROSCOPIC-ADD ON

## 2011-11-20 ENCOUNTER — Other Ambulatory Visit: Payer: Self-pay | Admitting: Family Medicine

## 2011-11-20 ENCOUNTER — Other Ambulatory Visit (HOSPITAL_COMMUNITY)
Admission: RE | Admit: 2011-11-20 | Discharge: 2011-11-20 | Disposition: A | Discharge status: Home / Self Care | Payer: Self-pay | Source: Ambulatory Visit | Attending: Family Medicine | Admitting: Family Medicine

## 2011-11-20 DIAGNOSIS — Z124 Encounter for screening for malignant neoplasm of cervix: Secondary | ICD-10-CM | POA: Insufficient documentation

## 2012-07-03 ENCOUNTER — Encounter (HOSPITAL_COMMUNITY): Payer: Self-pay | Admitting: Emergency Medicine

## 2012-07-03 ENCOUNTER — Emergency Department (INDEPENDENT_AMBULATORY_CARE_PROVIDER_SITE_OTHER)
Admission: EM | Admit: 2012-07-03 | Discharge: 2012-07-03 | Discharge status: Against Medical Advice | Payer: Self-pay | Source: Home / Self Care

## 2012-07-03 ENCOUNTER — Emergency Department (INDEPENDENT_AMBULATORY_CARE_PROVIDER_SITE_OTHER)
Admission: EM | Admit: 2012-07-03 | Discharge: 2012-07-03 | Disposition: A | Discharge status: Home / Self Care | Payer: Self-pay | Source: Home / Self Care | Attending: Emergency Medicine | Admitting: Emergency Medicine

## 2012-07-03 DIAGNOSIS — A499 Bacterial infection, unspecified: Secondary | ICD-10-CM

## 2012-07-03 DIAGNOSIS — N760 Acute vaginitis: Secondary | ICD-10-CM

## 2012-07-03 DIAGNOSIS — B9689 Other specified bacterial agents as the cause of diseases classified elsewhere: Secondary | ICD-10-CM

## 2012-07-03 LAB — WET PREP, GENITAL

## 2012-07-03 LAB — POCT URINALYSIS DIP (DEVICE)
Glucose, UA: NEGATIVE mg/dL
Ketones, ur: NEGATIVE mg/dL
Protein, ur: NEGATIVE mg/dL
Specific Gravity, Urine: 1.02 (ref 1.005–1.030)
Urobilinogen, UA: 0.2 mg/dL (ref 0.0–1.0)

## 2012-07-03 MED ORDER — METRONIDAZOLE 500 MG PO TABS: 500.0000 mg | ORAL_TABLET | Freq: Two times a day (BID) | ORAL | Status: AC

## 2012-07-03 NOTE — ED Notes (Signed)
Pt c/o a clear vaginal discharge x2 weeks along w/foul odor, and burning when urinating... Denies fevers, vomiting, nausea, diarrhea and blood... Also denies abd/pelvic pain.  

## 2012-07-03 NOTE — ED Provider Notes (Signed)
Chief Complaint  Patient presents with  . Vaginal Discharge    History of Present Illness:   [name] is a 36 year old female with a two-week history of a vaginal discharge. The discharge is clear and thin and malodorous. She denies any itching, irritation, pelvic pain, or lower back pain. She's had no fever, chills, nausea, vomiting, or urinary symptoms. She does have a history of bacterial vaginosis in the past.  Review of Systems:  Other than noted above, the patient denies any of the following symptoms: Systemic:  No fever, chills, sweats, fatigue, or weight loss. GI:  No abdominal pain, nausea, anorexia, vomiting, diarrhea, constipation, melena or hematochezia. GU:  No dysuria, frequency, urgency, hematuria, vaginal discharge, itching, or abnormal vaginal bleeding. Skin:  No rash or itching.  PMFSH:  Past medical history, family history, social history, meds, and allergies were reviewed.  Physical Exam:   Vital signs:  BP 152/103  Pulse 72  Temp 97.6 F (36.4 C) (Oral)  Resp 20  SpO2 100%  LMP 06/04/2012 General:  Alert, oriented and in no distress. Lungs:  Breath sounds clear and equal bilaterally.  No wheezes, rales or rhonchi. Heart:  Regular rhythm.  No gallops or murmers. Abdomen:  Soft, flat and non-distended.  No organomegaly or mass.  No tenderness, guarding or rebound.  Bowel sounds normally active. Pelvic exam:  Normal external genitalia, there is a copious, brownish, malodorous discharge. Cervix and vaginal mucosa appear normal. No cervical motion tenderness. Uterus is normal in size and nontender. No adnexal masses or tenderness. Skin:  Clear, warm and dry.  Labs:   Results for orders placed during the hospital encounter of 07/03/12  WET PREP, GENITAL      Component Value Range   Yeast Wet Prep HPF POC NONE SEEN  NONE SEEN   Trich, Wet Prep NONE SEEN  NONE SEEN   Clue Cells Wet Prep HPF POC MODERATE (*) NONE SEEN   WBC, Wet Prep HPF POC TOO NUMEROUS TO COUNT (*)  NONE SEEN     Assessment:  The encounter diagnosis was Bacterial vaginosis.  Plan:   1.  The following meds were prescribed:   New Prescriptions   METRONIDAZOLE (FLAGYL) 500 MG TABLET    Take 1 tablet (500 mg total) by mouth 2 (two) times daily.   2.  The patient was instructed in symptomatic care and handouts were given. 3.  The patient was told to return if becoming worse in any way, if no better in 3 or 4 days, and given some red flag symptoms that would indicate earlier return.    Reuben Likes, MD 07/03/12 2252

## 2012-07-03 NOTE — ED Notes (Signed)
Pt c/o a clear vaginal discharge x2 weeks along w/foul odor, and burning when urinating... Denies fevers, vomiting, nausea, diarrhea and blood... Also denies abd/pelvic pain.

## 2012-07-04 LAB — URINE CULTURE
Colony Count: NO GROWTH
Culture: NO GROWTH

## 2012-07-04 LAB — GC/CHLAMYDIA PROBE AMP, GENITAL: Chlamydia, DNA Probe: NEGATIVE

## 2012-07-06 NOTE — ED Notes (Signed)
GC/Chlamydia neg., Wet prep: Mod. clue cells, WBC's TNTC, Urine culture: No growth. Pt. adequately treated with Flagyl. Vassie Moselle 07/06/2012

## 2012-11-26 ENCOUNTER — Encounter (INDEPENDENT_AMBULATORY_CARE_PROVIDER_SITE_OTHER): Payer: Self-pay | Admitting: Surgery

## 2012-11-26 ENCOUNTER — Other Ambulatory Visit (INDEPENDENT_AMBULATORY_CARE_PROVIDER_SITE_OTHER): Payer: Self-pay

## 2012-11-26 ENCOUNTER — Ambulatory Visit (INDEPENDENT_AMBULATORY_CARE_PROVIDER_SITE_OTHER): Payer: 59 | Admitting: Surgery

## 2012-11-26 DIAGNOSIS — Z6841 Body Mass Index (BMI) 40.0 and over, adult: Secondary | ICD-10-CM

## 2012-11-26 NOTE — Progress Notes (Addendum)
Re:   [name] DOB:   02-22-1976 MRN:   [id number]  ASSESSMENT AND PLAN: 1.  Morbid obesity  She is interested in a lap band.  Per the 1991 NIH Consensus Statement, the patient is a candidate for bariatric surgery.  The patient attended our information session and reviewed the different types of bariatric surgery.    The patient is interested in the laparoscopic adjustable gastric band.  I discussed with the patient the indications and risks of lap band surgery.  The potential risks of surgery include, but are not limited to, bleeding, infection, DVT and PE, slippage and erosion of the band, open surgery, and death.  The patient understands the importance of compliance and long term follow-up with our group after surgery.  She was given literature regarding lap band surgery.  From here we'll obtain labs, x-rays, nutrition consult, and psych consult.  She also will try to attend a support group.  2.  Right lung/cardiac cyst - 2004 - Dr. Delrae Alfred 3.  She had A. Fib post lung surgery - this resolved after 1 month. [H. Pylori positive - 12/23/2012.  DN  12/23/2012]  Chief Complaint  Patient presents with  . Bariatric Pre-op    Initial   REFERRING PHYSICIAN: Thora Lance, MD  HISTORY OF PRESENT ILLNESS: [name] is a 37 y.o. (DOB: Nov 10, 1975)  AA female whose primary care physician is Thora Lance, MD and comes to me today for bariatric surgery - lap band.  The patient went to the information session given by Dr. Johna Sheriff.  She has tried multiple diets including low-carb, low calorie, and Atkins diet. She has had the most success with Atkins diet where she lost about 70 pounds.   She has tried Phentermine.  She does not know anybody has had bariatric surgery.  (But she later admitted that her mother had gastric stapling in the 1980's.)  Discussed with her the bariatric support group.   History reviewed. No pertinent past medical history.    Past Surgical History    Procedure Date  . Cystectomy   . Dilation and curettage of uterus   . Cesarean section     Current Outpatient Prescriptions  Medication Sig Dispense Refill  . norethindrone-ethinyl estradiol (JUNEL FE,GILDESS FE,LOESTRIN FE) 1-20 MG-MCG tablet Take 1 tablet by mouth daily.          Allergies  Allergen Reactions  . Tobramycin     REVIEW OF SYSTEMS: Skin:  No history of rash.  No history of abnormal moles. Infection:  No history of hepatitis or HIV.  No history of MRSA. Neurologic:  No history of stroke.  No history of seizure.  No history of headaches. Cardiac:  No history of hypertension. No history of heart disease.  No history of prior cardiac catheterization.  No history of seeing a cardiologist. Pulmonary:  Right lung/cardiac cyst right chest - Dr. Edwyna Shell - 2004  Endocrine:  No diabetes. No thyroid disease. Gastrointestinal:  No history of stomach disease.  No history of liver disease.  No history of gall bladder disease.  No history of pancreas disease.  No history of colon disease. Urologic:  No history of kidney stones.  No history of bladder infections. GYN:  She is on her period now. Musculoskeletal:  No history of joint or back disease. Hematologic:  No bleeding disorder.  No history of anemia.  Not anticoagulated. Psycho-social:  The patient is oriented.   The patient has no obvious psychologic or social impairment to  understanding our conversation and plan.  SOCIAL and FAMILY HISTORY: Separated. Has twin boys, 39 yo. 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  She lives in [place], [place], 3 hours away.  Her mother had a gastric stapling.  PHYSICAL EXAM: BP 134/72  Pulse 84  Temp 98 F (36.7 C) (Oral)  Resp 20  Ht 5\' 9"  (1.753 m)  Wt 288 lb 6.4 oz (130.817 kg)  BMI 42.59 kg/m2  General: AA obese F who is alert and generally healthy appearing.  HEENT: Normal. Pupils equal. Neck: Supple. No mass.  No thyroid mass. Lymph  Nodes:  No supraclavicular or cervical nodes. Lungs: Clear to auscultation and symmetric breath sounds.  Right thoracotomy incision. Heart:  RRR. No murmur or rub.  Abdomen: Soft. No mass. No tenderness. No hernia. Normal bowel sounds.  No abdominal scars.  She is more pear than apple. Rectal: Not done because she is on her period Extremities:  Good strength and ROM  in upper and lower extremities.  She has large lower legs. Neurologic:  Grossly intact to motor and sensory function. Psychiatric: Has normal mood and affect. Behavior is normal.   DATA REVIEWED: Notes.  Ovidio Kin, MD,  [facility] [address] [place]  [address]   [place], [place]    [address] Phone:  [phone] FAX:  [phone]

## 2012-12-04 ENCOUNTER — Ambulatory Visit (INDEPENDENT_AMBULATORY_CARE_PROVIDER_SITE_OTHER): Payer: 59 | Admitting: Surgery

## 2012-12-08 ENCOUNTER — Encounter: Payer: 59 | Attending: Surgery | Admitting: *Deleted

## 2012-12-08 ENCOUNTER — Encounter: Payer: Self-pay | Admitting: *Deleted

## 2012-12-08 DIAGNOSIS — Z01818 Encounter for other preprocedural examination: Secondary | ICD-10-CM | POA: Insufficient documentation

## 2012-12-08 DIAGNOSIS — Z713 Dietary counseling and surveillance: Secondary | ICD-10-CM | POA: Insufficient documentation

## 2012-12-08 NOTE — Patient Instructions (Addendum)
   Follow Pre-Op Nutrition Goals to prepare for Lapband Surgery.  Aim for:  1600 calories, 200 g carbs, 90-100 g protein, and 45 g fat daily  Aim for 30+ minutes of physical activity daily  Call the [facility] at [phone] once you have been given your surgery date to enrolled in the Pre-Op Nutrition Class. You will need to attend this nutrition class 3-4 weeks prior to your surgery.

## 2012-12-08 NOTE — Progress Notes (Signed)
  Pre-Op Assessment Visit:  Pre-Operative LAGB Surgery  Medical Nutrition Therapy:  Appt start time: 0900   End time:  1000.  Patient was seen on 12/08/2012 for Pre-Operative LAGB Nutrition Assessment. Assessment and letter of approval faxed to [facility] Bariatric Surgery Program coordinator on 12/08/2012.  Approval letter sent to [facility] and will be available in the chart under the media tab.  Handouts given during visit include:  Pre-Op Goals   Bariatric Surgery Protein [facility] Supp[facility] Price List  Bariatric Support Group 2014 Calendar  Meal Planning Card  Samples given during visit include:   Unjury Protein Powder Lot # [id number]; Exp: 02/15 Lot # [id number]; Exp: 03/15 Lot # [id number]; Exp: 03/15  Goals:  Follow Pre-Op Nutrition Goals to prepare for Lapband Surgery.  Aim for:  1600 calories, 200 g carbs, 90-100 g protein, and 45 g fat daily  Aim for 30+ minutes of physical activity daily  Call the [facility] at [phone] once you have been given your surgery date to enrolled in the Pre-Op Nutrition Class. You will need to attend this nutrition class 3-4 weeks prior to your surgery.   Patient to call for Pre-Op and Post-Op Nutrition Education at the [facility] when surgery is scheduled.

## 2012-12-09 ENCOUNTER — Other Ambulatory Visit: Payer: Self-pay

## 2012-12-09 ENCOUNTER — Ambulatory Visit (HOSPITAL_COMMUNITY)
Admission: RE | Admit: 2012-12-09 | Discharge: 2012-12-09 | Disposition: A | Discharge status: Home / Self Care | Payer: 59 | Source: Ambulatory Visit | Attending: Surgery | Admitting: Surgery

## 2012-12-09 DIAGNOSIS — K224 Dyskinesia of esophagus: Secondary | ICD-10-CM | POA: Insufficient documentation

## 2012-12-09 DIAGNOSIS — Z6841 Body Mass Index (BMI) 40.0 and over, adult: Secondary | ICD-10-CM | POA: Insufficient documentation

## 2012-12-09 LAB — COMPREHENSIVE METABOLIC PANEL
Albumin: 4.1 g/dL (ref 3.5–5.2)
Alkaline Phosphatase: 61 U/L (ref 39–117)
BUN: 14 mg/dL (ref 6–23)
CO2: 27 mEq/L (ref 19–32)
Calcium: 9.5 mg/dL (ref 8.4–10.5)
Chloride: 106 mEq/L (ref 96–112)
Glucose, Bld: 91 mg/dL (ref 70–99)
Potassium: 4.4 mEq/L (ref 3.5–5.3)
Sodium: 139 mEq/L (ref 135–145)
Total Protein: 6.8 g/dL (ref 6.0–8.3)

## 2012-12-09 LAB — CBC WITH DIFFERENTIAL/PLATELET
Basophils Relative: 0 % (ref 0–1)
HCT: 37.6 % (ref 36.0–46.0)
Hemoglobin: 11.8 g/dL — ABNORMAL LOW (ref 12.0–15.0)
Lymphocytes Relative: 19 % (ref 12–46)
Lymphs Abs: 1.9 10*3/uL (ref 0.7–4.0)
MCHC: 31.4 g/dL (ref 30.0–36.0)
Monocytes Absolute: 0.7 10*3/uL (ref 0.1–1.0)
Monocytes Relative: 7 % (ref 3–12)
Neutro Abs: 7.3 10*3/uL (ref 1.7–7.7)
Neutrophils Relative %: 73 % (ref 43–77)
RBC: 5.18 MIL/uL — ABNORMAL HIGH (ref 3.87–5.11)

## 2012-12-10 ENCOUNTER — Ambulatory Visit (HOSPITAL_COMMUNITY): Payer: 59

## 2012-12-23 ENCOUNTER — Encounter (HOSPITAL_COMMUNITY)
Admission: RE | Disposition: A | Discharge status: Home / Self Care | Payer: Self-pay | Source: Ambulatory Visit | Attending: Surgery

## 2012-12-23 ENCOUNTER — Ambulatory Visit (HOSPITAL_COMMUNITY)
Admission: RE | Admit: 2012-12-23 | Discharge: 2012-12-23 | Disposition: A | Discharge status: Home / Self Care | Payer: 59 | Source: Ambulatory Visit | Attending: Surgery | Admitting: Surgery

## 2012-12-23 ENCOUNTER — Telehealth (INDEPENDENT_AMBULATORY_CARE_PROVIDER_SITE_OTHER): Payer: Self-pay

## 2012-12-23 DIAGNOSIS — Z01818 Encounter for other preprocedural examination: Secondary | ICD-10-CM | POA: Insufficient documentation

## 2012-12-23 HISTORY — PX: BREATH TEK H PYLORI: SHX5422

## 2012-12-23 SURGERY — BREATH TEST, FOR HELICOBACTER PYLORI

## 2012-12-23 NOTE — Telephone Encounter (Signed)
Rx Prev - Pac one po bid x 14 days #28  phoned to [facility] [address] [phone] Pt aware. Patient will call when ABT is completed to [facility]  Okey Regal to  schedule  2nd Breath - Tek

## 2012-12-24 ENCOUNTER — Telehealth (INDEPENDENT_AMBULATORY_CARE_PROVIDER_SITE_OTHER): Payer: Self-pay

## 2012-12-24 NOTE — Telephone Encounter (Signed)
[facility] called and stated that breaking down the prescription would greatly decrease the cost of the medication.  Original prescription was over $700.  Approved for prescription to be broken down so patient can afford medication.

## 2012-12-24 NOTE — Telephone Encounter (Signed)
[facility]  aware of new order for ABT for positive breath Tek

## 2012-12-24 NOTE — Telephone Encounter (Signed)
Pharmacy called stating prior authorization is needed for lansoprazole for the prev pak. I paged Dr Ezzard Standing and he said protonix can be used instead of lansoprazole. Called it into pharmacy.

## 2012-12-28 ENCOUNTER — Encounter (HOSPITAL_COMMUNITY): Payer: Self-pay | Admitting: Surgery

## 2013-01-27 ENCOUNTER — Encounter (HOSPITAL_COMMUNITY)
Admission: RE | Disposition: A | Discharge status: Home / Self Care | Payer: Self-pay | Source: Ambulatory Visit | Attending: Surgery

## 2013-01-27 ENCOUNTER — Ambulatory Visit (HOSPITAL_COMMUNITY)
Admission: RE | Admit: 2013-01-27 | Discharge: 2013-01-27 | Disposition: A | Discharge status: Home / Self Care | Payer: 59 | Source: Ambulatory Visit | Attending: Surgery | Admitting: Surgery

## 2013-01-27 DIAGNOSIS — Z01818 Encounter for other preprocedural examination: Secondary | ICD-10-CM | POA: Insufficient documentation

## 2013-01-27 HISTORY — PX: BREATH TEK H PYLORI: SHX5422

## 2013-01-27 SURGERY — BREATH TEST, FOR HELICOBACTER PYLORI

## 2013-01-28 ENCOUNTER — Encounter (HOSPITAL_COMMUNITY): Payer: Self-pay | Admitting: Surgery

## 2013-02-05 ENCOUNTER — Other Ambulatory Visit (INDEPENDENT_AMBULATORY_CARE_PROVIDER_SITE_OTHER): Payer: Self-pay | Admitting: Surgery

## 2013-02-11 ENCOUNTER — Encounter: Payer: BC Managed Care – PPO | Attending: Surgery

## 2013-02-11 ENCOUNTER — Encounter: Payer: Self-pay | Admitting: *Deleted

## 2013-02-11 DIAGNOSIS — Z713 Dietary counseling and surveillance: Secondary | ICD-10-CM | POA: Insufficient documentation

## 2013-02-11 DIAGNOSIS — Z01818 Encounter for other preprocedural examination: Secondary | ICD-10-CM | POA: Insufficient documentation

## 2013-02-11 NOTE — Progress Notes (Signed)
Subjective:     Patient ID: [name], female   DOB: [birth year], 37 y.o.   MRN: [id number]  HPI   Encounter opened in error. Roslynn Amble, RN   Review of Systems .     Objective:   Physical Exam     Assessment:         Plan:

## 2013-02-11 NOTE — Progress Notes (Signed)
Patient was seen on 02/11/2013 for Pre-Operative Bariatric Surgery Education at [facility].    The following the learning objective met by the patient during this course:  Identifies Pre-Op Dietary Goals and will begin 2 weeks pre-operatively  Identifies appropriate sources of fluids and proteins  States protein recommendations and appropriate sources pre and post-operatively  Identifies Post-Operative Dietary Goals and will follow for 2 weeks post-operatively  Identifies appropriate multivitamin and calcium sources  Describes the need for physical activity post-operatively and will follow MD recommendations  States when to call healthcare provider regarding medication questions or post-operative complications  Handouts given during class include:  Pre-Op Bariatric Surgery Diet Handout  Protein Shake Handout  Post-Op Bariatric Surgery Nutrition Handout  BELT Program Information Flyer  Support Group Information Flyer  Follow-Up Plan:  Patient will follow-up at [facility] 2 weeks post operatively for diet advancement per MD.  Patient Instructions  Follow:  Pre-Op Diet per MD 2 weeks prior to surgery  Phase 2- Liquids (clear/full) 2 weeks after surgery  Vitamin/Mineral/Calcium guidelines for purchasing bariatric supplements  Exercise guidelines pre and post-op per MD  Follow-up at [facility] in 2 weeks post-op for diet advancement. Contact Sara Himmelrich as needed with questions/concerns.

## 2013-02-11 NOTE — Progress Notes (Deleted)
Subjective:     Patient ID: [name], female   DOB: 02-Jan-1976, 37 y.o.   MRN: [id number]  HPI   Review of Systems     Objective:   Physical Exam     Assessment:     ***    Plan:     ***

## 2013-02-15 ENCOUNTER — Encounter (HOSPITAL_COMMUNITY): Payer: Self-pay | Admitting: Pharmacy Technician

## 2013-02-15 NOTE — Patient Instructions (Addendum)
Iantha Weygandt  02/15/2013                           YOUR PROCEDURE IS SCHEDULED ON: 03/01/13               PLEASE REPORT TO SHORT STAY CENTER AT : 9:15 AM               CALL THIS NUMBER IF ANY PROBLEMS THE DAY OF SURGERY :               [phone]                      REMEMBER:   Do not eat food or drink liquids AFTER MIDNIGHT    Take these medicines the morning of surgery with A SIP OF WATER:   Do not wear jewelry, make-up   Do not wear lotions, powders, or perfumes.   Do not shave legs or underarms 12 hrs. before surgery (men may shave face)  Do not bring valuables to the hospital.  Contacts, dentures or bridgework may not be worn into surgery.  Leave suitcase in the car. After surgery it may be brought to your room.  For patients admitted to the hospital more than one night, checkout time is 11:00                          The day of discharge.   Patients discharged the day of surgery will not be allowed to drive home                             If going home same day of surgery, must have someone stay with you first                           24 hrs at home and arrange for some one to drive you home from hospital.    Special Instructions:   Please read over the following fact sheets that you were given:               1. MRSA  INFORMATION                      2. [place] PREPARING FOR SURGERY SHEET              3. STOP ALL ASPIRIN AND HERBAL MEDICATIONS 5 DAYS PREOP                                                X_____________________________________________________________________        Failure to follow these instructions may result in cancellation of your surgery

## 2013-02-16 ENCOUNTER — Encounter (HOSPITAL_COMMUNITY): Payer: Self-pay

## 2013-02-16 ENCOUNTER — Encounter (HOSPITAL_COMMUNITY)
Admission: RE | Admit: 2013-02-16 | Discharge: 2013-02-16 | Disposition: A | Discharge status: Home / Self Care | Payer: 59 | Source: Ambulatory Visit | Attending: Surgery | Admitting: Surgery

## 2013-02-16 HISTORY — DX: Personal history of other medical treatment: Z92.89

## 2013-02-16 HISTORY — DX: Anemia, unspecified: D64.9

## 2013-02-16 HISTORY — DX: Other allergy status, other than to drugs and biological substances: Z91.09

## 2013-02-16 HISTORY — DX: Cardiac arrhythmia, unspecified: I49.9

## 2013-02-16 LAB — BASIC METABOLIC PANEL
CO2: 25 mEq/L (ref 19–32)
Calcium: 9.8 mg/dL (ref 8.4–10.5)
Creatinine, Ser: 0.98 mg/dL (ref 0.50–1.10)
Glucose, Bld: 102 mg/dL — ABNORMAL HIGH (ref 70–99)

## 2013-02-16 LAB — CBC
HCT: 41 % (ref 36.0–46.0)
Hemoglobin: 13.1 g/dL (ref 12.0–15.0)
MCH: 23.6 pg — ABNORMAL LOW (ref 26.0–34.0)
MCV: 73.9 fL — ABNORMAL LOW (ref 78.0–100.0)
RBC: 5.55 MIL/uL — ABNORMAL HIGH (ref 3.87–5.11)

## 2013-02-18 ENCOUNTER — Ambulatory Visit (INDEPENDENT_AMBULATORY_CARE_PROVIDER_SITE_OTHER): Payer: 59 | Admitting: Surgery

## 2013-02-18 DIAGNOSIS — Z6841 Body Mass Index (BMI) 40.0 and over, adult: Secondary | ICD-10-CM

## 2013-02-18 NOTE — Progress Notes (Addendum)
 Re:   [name] DOB:   06/25/1976 MRN:   [id number]  ASSESSMENT AND PLAN: 1.  Morbid obesity  She is here for pre op lap band discussion.  Per the 1991 NIH Consensus Statement, the patient is a candidate for bariatric surgery.  The patient attended our information session and reviewed the different types of bariatric surgery.    The patient is interested in the laparoscopic adjustable gastric band.  I discussed with the patient the indications and risks of lap band surgery.  The potential risks of surgery include, but are not limited to, bleeding, infection, DVT and PE, slippage and erosion of the band, open surgery, and death.  The patient understands the importance of compliance and long term follow-up with our group after surgery.  She has insurance issues about the timing of the surgery.  We plan to move her up before the end of the month.  It looks like it will be a late case on Monday, 02/22/2013, so I will keep her overnight.  I explained the post op course.  2.  Right lung/cardiac cyst - 2004 - Dr. P. Burney 3.  She had A. Fib post lung surgery - this resolved after 1 month. 4.  H. Pylori positive   Treated 11/2012 - negative on 01/27/2013  Chief Complaint  Patient presents with  . Bariatric Pre-op    pre op lap band   REFERRING PHYSICIAN: EHINGER,ROBERT R, MD  HISTORY OF PRESENT ILLNESS: [name] is a 37 y.o. (DOB: 06/17/1976)  AA female whose primary care physician is EHINGER,ROBERT R, MD and comes to me today for her pre op visit for a lap band.  She is ready for the surgery.  Her issue is with her insurance coverage lapsing at the end of this month.  Her insurance will pick up again in June.  Otherwise, she understands the surgery.  She had a list of questions that I went over.  These questions involved complications, follow up with our office, and access to our office.  We also talked about pills/meds that she takes.  Her mother will be there the day of surgery.  And  she has a "cousin" flying in from [place].  [place] - 12/09/2012 - Negative UGI - 12/09/2012 - Normal anatomy Psych - saw Dr. Lurey - 12/16/2012  History of weight problems: The patient went to the information session given by Dr. Hoxworth.  She has tried multiple diets including low-carb, low calorie, and Atkins diet. She has had the most success with Atkins diet where she lost about 70 pounds.   She has tried Phentermine. She does not know anybody has had bariatric surgery.  (But she later admitted that her mother had gastric stapling in the 1980's.)  Discussed with her the bariatric support group.   Past Medical History  Diagnosis Date  . Heart murmur   . Morbid obesity   . Dysrhythmia     HISTORY OF AFIB X 2 MONTHS 2004 - RESOLVED 1 MONTH POST BRONCHIOGENIC CYST REMOVAL  . Environmental allergies   . History of transfusion   . Anemia       Past Surgical History  Procedure Laterality Date  . Cystectomy  2004    Bronchialgenic cyst  . Dilation and curettage of uterus  2008  . Cesarean section  12/2005    Twins  . Breath tek h pylori N/A 12/23/2012    Procedure: BREATH TEK H PYLORI;  Surgeon: Willadene Mounsey H Henok Heacock, MD;  Location: [facility]   ENDOSCOPY;  Service: General;  Laterality: N/A;  . Breath tek h pylori N/A 01/27/2013    Procedure: BREATH TEK H PYLORI;  Surgeon: Marzelle Rutten H Kennadie Brenner, MD;  Location: [facility] ENDOSCOPY;  Service: General;  Laterality: N/A;    Current Outpatient Prescriptions  Medication Sig Dispense Refill  . loratadine (CLARITIN) 10 MG tablet Take 10 mg by mouth daily.      . Multiple Vitamins-Minerals (ONE DAILY WOMENS) TABS Take 1 tablet by mouth daily.      . norethindrone-ethinyl estradiol (JUNEL FE,GILDESS FE,LOESTRIN FE) 1-20 MG-MCG tablet Take 1 tablet by mouth daily.      . phentermine (ADIPEX-P) 37.5 MG tablet Take 37.5 mg by mouth daily before breakfast.       No current facility-administered medications for this visit.      Allergies  Allergen Reactions  . Tobramycin Other  (See Comments)    Swelling and redness    REVIEW OF SYSTEMS: Skin:  No history of rash.  No history of abnormal moles. Infection:  No history of hepatitis or HIV.  No history of MRSA. Neurologic:  No history of stroke.  No history of seizure.  No history of headaches. Cardiac:  No history of hypertension. No history of heart disease.  No history of prior cardiac catheterization.  No history of seeing a cardiologist. Pulmonary:  Right lung/cardiac cyst right chest - Dr. Burney - 2004  Endocrine:  No diabetes. No thyroid disease. Gastrointestinal:  No history of stomach disease.  No history of liver disease.  No history of gall bladder disease.  No history of pancreas disease.  No history of colon disease. Urologic:  No history of kidney stones.  No history of bladder infections. GYN:  She is on her period now. Musculoskeletal:  No history of joint or back disease. Hematologic:  No bleeding disorder.  No history of anemia.  Not anticoagulated. Psycho-social:  The patient is oriented.   The patient has no obvious psychologic or social impairment to understanding our conversation and plan.  SOCIAL and FAMILY HISTORY: Separated. Has twin boys, 39 yo. 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  She lives in [place], [place], 3 hours away.  Her mother had a gastric stapling.  PHYSICAL EXAM: BP 138/80  Pulse 80  Temp(Src) 97 F (36.1 C)  Resp 20  Ht 5' 8.5" (1.74 m)  Wt 281 lb (127.461 kg)  BMI 42.1 kg/m2  LMP 02/15/2013  General: AA obese F who is alert and generally healthy appearing.  HEENT: Normal. Pupils equal. Neck: Supple. No mass.  No thyroid mass. Lymph Nodes:  No supraclavicular or cervical nodes. Lungs: Clear to auscultation and symmetric breath sounds.  Right thoracotomy incision. Heart:  RRR. No murmur or rub.  Abdomen: Soft. No mass. No tenderness. No hernia. Normal bowel sounds.  No abdominal scars.  She is more pear than  apple. Extremities:  Good strength and ROM  in upper and lower extremities.  She has large lower legs. Neurologic:  Grossly intact to motor and sensory function. Psychiatric: Has normal mood and affect. Behavior is normal.   DATA REVIEWED: Labs and tests done.  Tishara Pizano, MD,  FACS [facility] [address]  [address]   [place], [place]    [address] Phone:  [phone] FAX:  [phone]  

## 2013-02-19 NOTE — Progress Notes (Signed)
Pt instructed to arrive at Short Stay at 12:45 pm on 02/23/13 and may have clear liquids 12:00 am to 9:15 AM and to move betasept showers to sun/mon pn 4/27 and 4/28

## 2013-02-21 NOTE — Progress Notes (Addendum)
DOCUMENTATION ONLY ON BEHALF OF Mercy Riding, RN FROM PRE-OP CLASS 02/11/13  TANITA  BODY COMP RESULTS  02/11/13   BMI (kg/m^2) 42.2   Fat Mass (lbs) 139.5   Fat Free Mass (lbs) 146.0   Total Body Water (lbs) 107.0   Samples given during visit include:   Bariatric Advantage Complete Multivitamin: 1 ea Lot # [id number]; Exp: 06/15  Bariatric Advantage Calcium Citrate: 1 ea Lot # [id number]; Exp: 10/15

## 2013-02-23 ENCOUNTER — Ambulatory Visit (HOSPITAL_COMMUNITY): Payer: 59 | Admitting: Anesthesiology

## 2013-02-23 ENCOUNTER — Encounter (HOSPITAL_COMMUNITY)
Admission: RE | Disposition: A | Discharge status: Home / Self Care | Payer: Self-pay | Source: Ambulatory Visit | Attending: Surgery

## 2013-02-23 ENCOUNTER — Observation Stay (HOSPITAL_COMMUNITY)
Admission: RE | Admit: 2013-02-23 | Discharge: 2013-02-24 | Disposition: A | Discharge status: Home / Self Care | Payer: 59 | Source: Ambulatory Visit | Attending: Surgery | Admitting: Surgery

## 2013-02-23 ENCOUNTER — Encounter (HOSPITAL_COMMUNITY): Payer: Self-pay | Admitting: *Deleted

## 2013-02-23 ENCOUNTER — Encounter (HOSPITAL_COMMUNITY): Payer: Self-pay | Admitting: Anesthesiology

## 2013-02-23 DIAGNOSIS — Z01812 Encounter for preprocedural laboratory examination: Secondary | ICD-10-CM | POA: Insufficient documentation

## 2013-02-23 DIAGNOSIS — Z79899 Other long term (current) drug therapy: Secondary | ICD-10-CM | POA: Insufficient documentation

## 2013-02-23 DIAGNOSIS — Z6841 Body Mass Index (BMI) 40.0 and over, adult: Secondary | ICD-10-CM | POA: Insufficient documentation

## 2013-02-23 HISTORY — PX: LAPAROSCOPIC GASTRIC BANDING: SHX1100

## 2013-02-23 SURGERY — GASTRIC BANDING, LAPAROSCOPIC
Anesthesia: General | Site: Abdomen | Wound class: Clean Contaminated

## 2013-02-23 MED ORDER — ACETAMINOPHEN 10 MG/ML IV SOLN
INTRAVENOUS | Status: DC | PRN
  Administered 2013-02-23: 1000 mg via INTRAVENOUS

## 2013-02-23 MED ORDER — NEOSTIGMINE METHYLSULFATE 1 MG/ML IJ SOLN
INTRAMUSCULAR | Status: DC | PRN
  Administered 2013-02-23: 4 mg via INTRAVENOUS

## 2013-02-23 MED ORDER — UNJURY CHICKEN SOUP POWDER
2.0000 [oz_av] | Freq: Four times a day (QID) | ORAL | Status: DC
  Administered 2013-02-24 (×3): 2 [oz_av] via ORAL

## 2013-02-23 MED ORDER — CHLORHEXIDINE GLUCONATE 4 % EX LIQD: 1.0000 "application " | Freq: Once | CUTANEOUS | Status: DC

## 2013-02-23 MED ORDER — FENTANYL CITRATE 0.05 MG/ML IJ SOLN
INTRAMUSCULAR | Status: DC | PRN
  Administered 2013-02-23 (×2): 100 ug via INTRAVENOUS
  Administered 2013-02-23: 50 ug via INTRAVENOUS

## 2013-02-23 MED ORDER — GLYCOPYRROLATE 0.2 MG/ML IJ SOLN
INTRAMUSCULAR | Status: DC | PRN
  Administered 2013-02-23: .5 mg via INTRAVENOUS

## 2013-02-23 MED ORDER — OXYCODONE-ACETAMINOPHEN 5-325 MG/5ML PO SOLN
5.0000 mL | ORAL | Status: DC | PRN
  Administered 2013-02-24 (×2): 5 mL via ORAL
  Administered 2013-02-24: 10 mL via ORAL
  Filled 2013-02-23: qty 10
  Filled 2013-02-23 (×2): qty 5

## 2013-02-23 MED ORDER — LACTATED RINGERS IV SOLN
INTRAVENOUS | Status: DC | PRN
  Administered 2013-02-23 (×2): via INTRAVENOUS

## 2013-02-23 MED ORDER — HEPARIN SODIUM (PORCINE) 5000 UNIT/ML IJ SOLN
5000.0000 [IU] | Freq: Three times a day (TID) | INTRAMUSCULAR | Status: DC
  Administered 2013-02-24 (×2): 5000 [IU] via SUBCUTANEOUS
  Filled 2013-02-23 (×4): qty 1

## 2013-02-23 MED ORDER — MIDAZOLAM HCL 5 MG/5ML IJ SOLN
INTRAMUSCULAR | Status: DC | PRN
  Administered 2013-02-23: 2 mg via INTRAVENOUS

## 2013-02-23 MED ORDER — UNJURY VANILLA POWDER: 2.0000 [oz_av] | Freq: Four times a day (QID) | ORAL | Status: DC

## 2013-02-23 MED ORDER — SUCCINYLCHOLINE CHLORIDE 20 MG/ML IJ SOLN
INTRAMUSCULAR | Status: DC | PRN
  Administered 2013-02-23: 100 mg via INTRAVENOUS

## 2013-02-23 MED ORDER — ONDANSETRON HCL 4 MG/2ML IJ SOLN
INTRAMUSCULAR | Status: DC | PRN
  Administered 2013-02-23: 4 mg via INTRAVENOUS

## 2013-02-23 MED ORDER — LACTATED RINGERS IV SOLN: INTRAVENOUS | Status: DC

## 2013-02-23 MED ORDER — BUPIVACAINE HCL (PF) 0.25 % IJ SOLN
INTRAMUSCULAR | Status: DC | PRN
  Administered 2013-02-23: 20 mL

## 2013-02-23 MED ORDER — HEPARIN SODIUM (PORCINE) 5000 UNIT/ML IJ SOLN
5000.0000 [IU] | Freq: Once | INTRAMUSCULAR | Status: AC
  Administered 2013-02-23: 5000 [IU] via SUBCUTANEOUS
  Filled 2013-02-23: qty 1

## 2013-02-23 MED ORDER — KCL IN DEXTROSE-NACL 20-5-0.45 MEQ/L-%-% IV SOLN
INTRAVENOUS | Status: DC
  Administered 2013-02-24: 100 mL via INTRAVENOUS
  Administered 2013-02-24: 16:00:00 via INTRAVENOUS
  Filled 2013-02-23 (×4): qty 1000

## 2013-02-23 MED ORDER — HYDROMORPHONE HCL PF 1 MG/ML IJ SOLN
0.2500 mg | INTRAMUSCULAR | Status: DC | PRN
  Administered 2013-02-23 (×3): 0.5 mg via INTRAVENOUS

## 2013-02-23 MED ORDER — PROPOFOL 10 MG/ML IV BOLUS
INTRAVENOUS | Status: DC | PRN
  Administered 2013-02-23: 180 mg via INTRAVENOUS

## 2013-02-23 MED ORDER — ROCURONIUM BROMIDE 100 MG/10ML IV SOLN
INTRAVENOUS | Status: DC | PRN
  Administered 2013-02-23 (×2): 10 mg via INTRAVENOUS
  Administered 2013-02-23: 40 mg via INTRAVENOUS

## 2013-02-23 MED ORDER — SODIUM CHLORIDE 0.9 % IJ SOLN
INTRAMUSCULAR | Status: DC | PRN
  Administered 2013-02-23: 10 mL via INTRAVENOUS

## 2013-02-23 MED ORDER — LIDOCAINE HCL (CARDIAC) 20 MG/ML IV SOLN
INTRAVENOUS | Status: DC | PRN
  Administered 2013-02-23: 20 mg via INTRAVENOUS

## 2013-02-23 MED ORDER — DEXTROSE 5 % IV SOLN
3.0000 g | INTRAVENOUS | Status: AC
  Administered 2013-02-23: 3 g via INTRAVENOUS

## 2013-02-23 MED ORDER — 0.9 % SODIUM CHLORIDE (POUR BTL) OPTIME
TOPICAL | Status: DC | PRN
  Administered 2013-02-23: 1000 mL

## 2013-02-23 MED ORDER — ONDANSETRON HCL 4 MG/2ML IJ SOLN
4.0000 mg | INTRAMUSCULAR | Status: DC | PRN
  Administered 2013-02-23 – 2013-02-24 (×3): 4 mg via INTRAVENOUS
  Filled 2013-02-23 (×3): qty 2

## 2013-02-23 MED ORDER — ACETAMINOPHEN 160 MG/5ML PO SOLN: 650.0000 mg | ORAL | Status: DC | PRN

## 2013-02-23 MED ORDER — MORPHINE SULFATE 2 MG/ML IJ SOLN
2.0000 mg | INTRAMUSCULAR | Status: DC | PRN
  Administered 2013-02-23: 2 mg via INTRAVENOUS
  Administered 2013-02-24: 4 mg via INTRAVENOUS
  Administered 2013-02-24 (×2): 2 mg via INTRAVENOUS
  Administered 2013-02-24 (×2): 4 mg via INTRAVENOUS
  Filled 2013-02-23 (×2): qty 2
  Filled 2013-02-23 (×3): qty 1
  Filled 2013-02-23: qty 2

## 2013-02-23 MED ORDER — UNJURY CHOCOLATE CLASSIC POWDER: 2.0000 [oz_av] | Freq: Four times a day (QID) | ORAL | Status: DC

## 2013-02-23 SURGICAL SUPPLY — 62 items
ADH SKN CLS APL DERMABOND .7 (GAUZE/BANDAGES/DRESSINGS) ×1
APL SKNCLS STERI-STRIP NONHPOA (GAUZE/BANDAGES/DRESSINGS)
BAND LAP 10.0 W/TUBES (Band) ×1 IMPLANT
BENZOIN TINCTURE PRP APPL 2/3 (GAUZE/BANDAGES/DRESSINGS) IMPLANT
BLADE HEX COATED 2.75 (ELECTRODE) ×2 IMPLANT
BLADE SURG 15 STRL LF DISP TIS (BLADE) ×1 IMPLANT
BLADE SURG 15 STRL SS (BLADE) ×2
CANISTER SUCTION 2500CC (MISCELLANEOUS) ×1 IMPLANT
CHLORAPREP W/TINT 26ML (MISCELLANEOUS) ×2 IMPLANT
CLOTH BEACON ORANGE TIMEOUT ST (SAFETY) ×2 IMPLANT
DECANTER SPIKE VIAL GLASS SM (MISCELLANEOUS) ×2 IMPLANT
DERMABOND ADVANCED (GAUZE/BANDAGES/DRESSINGS) ×1
DERMABOND ADVANCED .7 DNX12 (GAUZE/BANDAGES/DRESSINGS) ×1 IMPLANT
DEVICE SUT QUICK LOAD TK 5 (STAPLE) ×6 IMPLANT
DEVICE SUT TI-KNOT TK 5X26 (MISCELLANEOUS) ×2 IMPLANT
DEVICE SUTURE ENDOST 10MM (ENDOMECHANICALS) IMPLANT
DISSECTOR BLUNT TIP ENDO 5MM (MISCELLANEOUS) IMPLANT
DRAPE CAMERA CLOSED 9X96 (DRAPES) ×2 IMPLANT
ELECT REM PT RETURN 9FT ADLT (ELECTROSURGICAL) ×2
ELECTRODE REM PT RTRN 9FT ADLT (ELECTROSURGICAL) ×1 IMPLANT
GLOVE BIOGEL M 8.0 STRL (GLOVE) ×1 IMPLANT
GLOVE BIOGEL PI IND STRL 7.0 (GLOVE) ×1 IMPLANT
GLOVE BIOGEL PI INDICATOR 7.0 (GLOVE) ×3
GLOVE SURG SIGNA 7.5 PF LTX (GLOVE) ×2 IMPLANT
GLOVE SURG SS PI 6.5 STRL IVOR (GLOVE) ×1 IMPLANT
GLOVE SURG SS PI 7.0 STRL IVOR (GLOVE) ×1 IMPLANT
GOWN STRL NON-REIN LRG LVL3 (GOWN DISPOSABLE) ×3 IMPLANT
GOWN STRL REIN XL XLG (GOWN DISPOSABLE) ×4 IMPLANT
HOVERMATT SINGLE USE (MISCELLANEOUS) ×2 IMPLANT
KIT BASIN OR (CUSTOM PROCEDURE TRAY) ×2 IMPLANT
MESH HERNIA 1X4 RECT BARD (Mesh General) IMPLANT
MESH HERNIA BARD 1X4 (Mesh General) ×1 IMPLANT
NDL SPNL 22GX3.5 QUINCKE BK (NEEDLE) ×1 IMPLANT
NEEDLE SPNL 22GX3.5 QUINCKE BK (NEEDLE) ×2 IMPLANT
NS IRRIG 1000ML POUR BTL (IV SOLUTION) ×2 IMPLANT
PACK UNIVERSAL I (CUSTOM PROCEDURE TRAY) ×2 IMPLANT
PENCIL BUTTON HOLSTER BLD 10FT (ELECTRODE) ×2 IMPLANT
SCALPEL HARMONIC ACE (MISCELLANEOUS) IMPLANT
SET IRRIG TUBING LAPAROSCOPIC (IRRIGATION / IRRIGATOR) IMPLANT
SOLUTION ANTI FOG 6CC (MISCELLANEOUS) ×2 IMPLANT
SPONGE LAP 18X18 X RAY DECT (DISPOSABLE) ×2 IMPLANT
STAPLER VISISTAT 35W (STAPLE) ×2 IMPLANT
STRIP CLOSURE SKIN 1/2X4 (GAUZE/BANDAGES/DRESSINGS) IMPLANT
SUT ETHIBOND 2 0 SH (SUTURE) ×6
SUT ETHIBOND 2 0 SH 36X2 (SUTURE) ×3 IMPLANT
SUT PROLENE 2 0 CT2 30 (SUTURE) ×2 IMPLANT
SUT SILK 0 (SUTURE) ×2
SUT SILK 0 30XBRD TIE 6 (SUTURE) ×1 IMPLANT
SUT SURGIDAC NAB ES-9 0 48 120 (SUTURE) IMPLANT
SUT VIC AB 2-0 SH 27 (SUTURE) ×2
SUT VIC AB 2-0 SH 27X BRD (SUTURE) ×1 IMPLANT
SUT VIC AB 5-0 PS2 18 (SUTURE) ×2 IMPLANT
SYR 20CC LL (SYRINGE) ×2 IMPLANT
SYR CONTROL 10ML LL (SYRINGE) ×2 IMPLANT
SYS KII OPTICAL ACCESS 15MM (TROCAR) ×2
SYSTEM KII OPTICAL ACCESS 15MM (TROCAR) ×1 IMPLANT
TOWEL OR 17X26 10 PK STRL BLUE (TOWEL DISPOSABLE) ×2 IMPLANT
TROCAR BLADELESS 15MM (ENDOMECHANICALS) ×2 IMPLANT
TROCAR BLADELESS OPT 5 100 (ENDOMECHANICALS) ×2 IMPLANT
TROCAR XCEL BLADELESS 5X75MML (TROCAR) ×6 IMPLANT
TUBE CALIBRATION LAPBAND (TUBING) ×2 IMPLANT
TUBING INSUFFLATION 10FT LAP (TUBING) ×2 IMPLANT

## 2013-02-23 NOTE — Anesthesia Preprocedure Evaluation (Addendum)
Anesthesia Evaluation  Patient identified by MRN, date of birth, ID band Patient awake    Reviewed: Allergy & Precautions, H&P , NPO status , Patient's Chart, lab work & pertinent test results, reviewed documented beta blocker date and time   Airway Mallampati: II TM Distance: >3 FB Neck ROM: full    Dental no notable dental hx. (+) Teeth Intact and Dental Advisory Given   Pulmonary neg pulmonary ROS,  breath sounds clear to auscultation  Pulmonary exam normal       Cardiovascular Exercise Tolerance: Good + dysrhythmias Atrial Fibrillation Rhythm:regular Rate:Normal  AF for 2 months 2004   Neuro/Psych negative neurological ROS  negative psych ROS   GI/Hepatic negative GI ROS, Neg liver ROS,   Endo/Other  negative endocrine ROSMorbid obesity  Renal/GU negative Renal ROS  negative genitourinary   Musculoskeletal   Abdominal   Peds  Hematology negative hematology ROS (+)   Anesthesia Other Findings   Reproductive/Obstetrics negative OB ROS                          Anesthesia Physical Anesthesia Plan  ASA: III  Anesthesia Plan: General   Post-op Pain Management:    Induction: Intravenous  Airway Management Planned: Oral ETT  Additional Equipment:   Intra-op Plan:   Post-operative Plan: Extubation in OR  Informed Consent: I have reviewed the patients History and Physical, chart, labs and discussed the procedure including the risks, benefits and alternatives for the proposed anesthesia with the patient or authorized representative who has indicated his/her understanding and acceptance.   Dental Advisory Given  Plan Discussed with: CRNA and Surgeon  Anesthesia Plan Comments:         Anesthesia Quick Evaluation

## 2013-02-23 NOTE — H&P (View-Only) (Signed)
Re:   [name] DOB:   Jun 16, 1976 MRN:   [id number]  ASSESSMENT AND PLAN: 1.  Morbid obesity  She is here for pre op lap band discussion.  Per the 1991 NIH Consensus Statement, the patient is a candidate for bariatric surgery.  The patient attended our information session and reviewed the different types of bariatric surgery.    The patient is interested in the laparoscopic adjustable gastric band.  I discussed with the patient the indications and risks of lap band surgery.  The potential risks of surgery include, but are not limited to, bleeding, infection, DVT and PE, slippage and erosion of the band, open surgery, and death.  The patient understands the importance of compliance and long term follow-up with our group after surgery.  She has insurance issues about the timing of the surgery.  We plan to move her up before the end of the month.  It looks like it will be a late case on Monday, 02/22/2013, so I will keep her overnight.  I explained the post op course.  2.  Right lung/cardiac cyst - 2004 - Dr. Delrae Alfred 3.  She had A. Fib post lung surgery - this resolved after 1 month. 4.  H. Pylori positive   Treated 11/2012 - negative on 01/27/2013  Chief Complaint  Patient presents with  . Bariatric Pre-op    pre op lap band   REFERRING PHYSICIAN: Thora Lance, MD  HISTORY OF PRESENT ILLNESS: [name] is a 37 y.o. (DOB: [birth year])  AA female whose primary care physician is Thora Lance, MD and comes to me today for her pre op visit for a lap band.  She is ready for the surgery.  Her issue is with her insurance coverage lapsing at the end of this month.  Her insurance will pick up again in June.  Otherwise, she understands the surgery.  She had a list of questions that I went over.  These questions involved complications, follow up with our office, and access to our office.  We also talked about pills/meds that she takes.  Her mother will be there the day of surgery.  And  she has a "cousin" flying in from [place].  [place] - 12/09/2012 - Negative UGI - 12/09/2012 - Normal anatomy Psych - saw Dr. Cyndia Skeeters - 12/16/2012  History of weight problems: The patient went to the information session given by Dr. Johna Sheriff.  She has tried multiple diets including low-carb, low calorie, and Atkins diet. She has had the most success with Atkins diet where she lost about 70 pounds.   She has tried Phentermine. She does not know anybody has had bariatric surgery.  (But she later admitted that her mother had gastric stapling in the 1980's.)  Discussed with her the bariatric support group.   Past Medical History  Diagnosis Date  . Heart murmur   . Morbid obesity   . Dysrhythmia     HISTORY OF AFIB X 2 MONTHS 2004 - RESOLVED 1 MONTH POST BRONCHIOGENIC CYST REMOVAL  . Environmental allergies   . History of transfusion   . Anemia       Past Surgical History  Procedure Laterality Date  . Cystectomy  2004    Bronchialgenic cyst  . Dilation and curettage of uterus  2008  . Cesarean section  12/2005    Twins  . Breath tek h pylori N/A 12/23/2012    Procedure: BREATH TEK H PYLORI;  Surgeon: Kandis Cocking, MD;  Location: [facility]  END[facility];  Service: General;  Laterality: N/A;  . Breath tek h pylori N/A 01/27/2013    Procedure: BREATH TEK H PYLORI;  Surgeon: Kandis Cocking, MD;  Location: [facility] ENDOSCOPY;  Service: General;  Laterality: N/A;    Current Outpatient Prescriptions  Medication Sig Dispense Refill  . loratadine (CLARITIN) 10 MG tablet Take 10 mg by mouth daily.      . Multiple Vitamins-Minerals (ONE DAILY WOMENS) TABS Take 1 tablet by mouth daily.      . norethindrone-ethinyl estradiol (JUNEL FE,GILDESS FE,LOESTRIN FE) 1-20 MG-MCG tablet Take 1 tablet by mouth daily.      . phentermine (ADIPEX-P) 37.5 MG tablet Take 37.5 mg by mouth daily before breakfast.       No current facility-administered medications for this visit.      Allergies  Allergen Reactions  . Tobramycin Other  (See Comments)    Swelling and redness    REVIEW OF SYSTEMS: Skin:  No history of rash.  No history of abnormal moles. Infection:  No history of hepatitis or HIV.  No history of MRSA. Neurologic:  No history of stroke.  No history of seizure.  No history of headaches. Cardiac:  No history of hypertension. No history of heart disease.  No history of prior cardiac catheterization.  No history of seeing a cardiologist. Pulmonary:  Right lung/cardiac cyst right chest - Dr. Edwyna Shell - 2004  Endocrine:  No diabetes. No thyroid disease. Gastrointestinal:  No history of stomach disease.  No history of liver disease.  No history of gall bladder disease.  No history of pancreas disease.  No history of colon disease. Urologic:  No history of kidney stones.  No history of bladder infections. GYN:  She is on her period now. Musculoskeletal:  No history of joint or back disease. Hematologic:  No bleeding disorder.  No history of anemia.  Not anticoagulated. Psycho-social:  The patient is oriented.   The patient has no obvious psychologic or social impairment to understanding our conversation and plan.  SOCIAL and FAMILY HISTORY: Separated. Has twin boys, 39 yo. 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  She lives in [place], [place], 3 hours away.  Her mother had a gastric stapling.  PHYSICAL EXAM: BP 138/80  Pulse 80  Temp(Src) 97 F (36.1 C)  Resp 20  Ht 5' 8.5" (1.74 m)  Wt 281 lb (127.461 kg)  BMI 42.1 kg/m2  LMP 02/15/2013  General: AA obese F who is alert and generally healthy appearing.  HEENT: Normal. Pupils equal. Neck: Supple. No mass.  No thyroid mass. Lymph Nodes:  No supraclavicular or cervical nodes. Lungs: Clear to auscultation and symmetric breath sounds.  Right thoracotomy incision. Heart:  RRR. No murmur or rub.  Abdomen: Soft. No mass. No tenderness. No hernia. Normal bowel sounds.  No abdominal scars.  She is more pear than  apple. Extremities:  Good strength and ROM  in upper and lower extremities.  She has large lower legs. Neurologic:  Grossly intact to motor and sensory function. Psychiatric: Has normal mood and affect. Behavior is normal.   DATA REVIEWED: Labs and tests done.  Ovidio Kin, MD,  [facility] [address]  [address]   [place], [place]    [address] Phone:  [phone] FAX:  [phone]

## 2013-02-23 NOTE — Interval H&P Note (Signed)
History and Physical Interval Note:  02/23/2013 5:45 PM  [name]  has presented today for surgery, with the diagnosis of morbid obesity  The various methods of treatment have been discussed with the patient and family.  Family has actually gone home.  The case has been bumped because of emergencies.  I will talk to the sister by phone when the case is over.  After consideration of risks, benefits and other options for treatment, the patient has consented to  Procedure(s) with comments: LAPAROSCOPIC GASTRIC BANDING (N/A) - Lap Band as a surgical intervention .    The patient's history has been reviewed, patient examined, no change in status, stable for surgery.  I have reviewed the patient's chart and labs.  Questions were answered to the patient's satisfaction.     [name]

## 2013-02-23 NOTE — Op Note (Addendum)
02/23/2013  7:36 PM  PATIENT:  [name], 37 y.o., female, MRN: [id number]  PREOP DIAGNOSIS:  morbid obesity  POSTOP DIAGNOSIS:   Morbid Obesity, Weight - 281, BMI - 42.1  PROCEDURE:   Laparoscopic adjustable gastric band, AP Standard  SURGEON:   Ovidio Kin, M.D.  Threasa HeadsSheron Nightingale, M.D.  ANESTHESIA:   general  Anesthesiologist: Gaetano Hawthorne, MD CRNA: Vista Lawman, CRNA; Thornell Mule, CRNA  General  EBL:  minimal  ml  BLOOD ADMINISTERED: none  LOCAL MEDICATIONS USED:   20 cc 1/4% marcaine  SPECIMEN:   None  COUNTS CORRECT:  YES  INDICATIONS FOR PROCEDURE:  [name] is a 37 y.o. (DOB: 1976-03-23) AA  female whose primary care physician is Thora Lance, MD and comes for adjustable laparoscopic gastric band placement.   The indications and risks of the Lap Band surgery were explained to the patient.  The risks include, but are not limited to, infection, bleeding, bowel injury, and failure to lose weight.  Long term risk include, but are not limited to, slippage and erosion of the Lap Band.  OPERATIVE NOTE:  The patient was taken to room #1 at [facility] for the operation.  Anesthesiologist: Gaetano Hawthorne, MD CRNA: Vista Lawman, CRNA; Thornell Mule, CRNA was the supervising anesthesiologist.  The patient's abdomen was prepped with Chloroprep and sterilely draped.  The patient was given Ancef at the beginning of the operation.   A time out was held and the surgical checklist reviewed.   The abdomen was accessed in the LUQ with an 5 mm trocar.  Five additional trocars were placed: a 5 mm trocar in the subxiphoid area for the liver retractor, a 5 mm trocar in the right costal margin, a 15 mm trocar to the right of midline, and a 5 mm trocar to the left of midline.   An abdominal exploration was carried out.  The liver was unremarkable. 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 The stomach was unremarkable.  The  remainder of the bowel, which could be seen, was unremarkable.   A test for a hiatal hernia was done using the sizing balloon.  The sizing tube was advanced into the stomach, 15 cc of air was placed in the sizing balloon, and the balloon pulled back to the esophageal hiatus.  There was no evidence of a hiatal hernia.  She has no GERD symptoms.   I made an incision at the Angle of Hiss and identified the left crus.  I then opened the gastrohepatic ligament over the caudate lobe of the liver.  I identified the right crus and passed the "finger" dissector behind the gastroesophageal junction.   I then used a AP Standard Lap Band and inserted the silastic tubing through the tip of the finger dissector and pulled the tubing around the proximal stomach.  The sizing tube was passed into the stomach and the lap band closed over the sizing tube.  The sizing tube was removed.   I then imbricated the lateral stomach over the lap band and sewed it to the stomach proximal to the lap band.  I used 2-0 Ethibond sutures secured with Tye-Knots to create the gastro-gastric imbrication over the lateral Lap Band.  I placed 3 sutures. Photos were taken and placed in the chart.  The lap band and tubing lay in good position.  The tubing was brought through the right lateral trocar site and attached to the lap  band reservoir.  The reservoir has a piece of polypropylene mesh sewn on the back to fixate it in the subcutaneous tissues.   I infiltrated Marcaine into each incision for a total of 20 cc.   Each incision was sewn with 5-0 Monocryl sutures and the skin was painted with Dermabond.  The patient tolerated the procedure well and was sent to the recovery room in good condition.  The sponge and needle count were correct at the end of the case.   Because of the lateness of the case, the patient will spend the night.  Talked to sister, Ledell Peoples, by phone.  [phone].  Ovidio Kin, MD, [facility]  [facility] Pager: [phone] Office phone:  [phone]

## 2013-02-23 NOTE — Preoperative (Signed)
Beta Blockers   Reason not to administer Beta Blockers:Not Applicable 

## 2013-02-23 NOTE — Transfer of Care (Signed)
Immediate Anesthesia Transfer of Care Note  Patient: [name]  Procedure(s) Performed: Procedure(s) with comments: LAPAROSCOPIC GASTRIC BANDING (N/A) - Lap Band  Patient Location: PACU  Anesthesia Type:General  Level of Consciousness: awake, alert , oriented and patient cooperative  Airway & Oxygen Therapy: Patient Spontanous Breathing and Patient connected to face mask oxygen  Post-op Assessment: Report given to PACU RN and Post -op Vital signs reviewed and stable  Post vital signs: Reviewed and stable  Complications: No apparent anesthesia complications

## 2013-02-24 ENCOUNTER — Encounter (HOSPITAL_COMMUNITY): Payer: Self-pay | Admitting: Surgery

## 2013-02-24 ENCOUNTER — Observation Stay (HOSPITAL_COMMUNITY): Payer: 59

## 2013-02-24 MED ORDER — OXYCODONE-ACETAMINOPHEN 5-325 MG/5ML PO SOLN: 5.0000 mL | ORAL | Status: DC | PRN

## 2013-02-24 MED ORDER — PROMETHAZINE HCL 25 MG/ML IJ SOLN
25.0000 mg | Freq: Once | INTRAMUSCULAR | Status: AC
  Administered 2013-02-24: 25 mg via INTRAMUSCULAR
  Filled 2013-02-24: qty 1

## 2013-02-24 NOTE — Progress Notes (Signed)
Pt discharged to home provided discharge instructions and prescriptions along with handouts. Pt verbalized understanding of discharge information. Discussed with patient bariatric discharge instructions due to bariatric nurse not having time to. Pt stable. Pt walked out by Lurena Joiner, RN  IV removed and documented. Annitta Needs, RN

## 2013-02-24 NOTE — Discharge Summary (Signed)
Physician Discharge Summary  Patient ID:  [name]  MRN: [id number]  DOB/AGE: 01-10-1976 37 y.o.  Admit date: 02/23/2013 Discharge date: 02/24/2013  Discharge Diagnoses:  1.  Morbid obesity 2.  Right lung/cardiac cyst - 2004 - Dr. Delrae Alfred  3.  She had A. Fib post lung surgery - this resolved after 1 month.  Operation: Procedure(s):   LAPAROSCOPIC GASTRIC BANDING, AP Standard, on 02/23/2013   Discharged Condition: good  Hospital Course: [name] is an 37 y.o. female whose primary care physician is Thora Lance, MD and who was admitted 02/23/2013 with a chief complaint of Morbid obesity.   She was brought to the operating room on 02/23/2013 and underwent  LAPAROSCOPIC GASTRIC BANDING, AP Standard.   She had wretching the morning after surgery.  But this cleared up and she tolerated the protein shakes.  She is now doing well.  In fact, she says that she is hungry.  Her KUB showed good position of the lap band. The discharge instructions were reviewed with the patient.  Consults: None  Significant Diagnostic Studies: Results for orders placed during the hospital encounter of 02/16/13  SURGICAL PCR SCREEN      Result Value Range   MRSA, PCR NEGATIVE  NEGATIVE   Staphylococcus aureus NEGATIVE  NEGATIVE  CBC      Result Value Range   WBC 10.1  4.0 - 10.5 K/uL   RBC 5.55 (*) 3.87 - 5.11 MIL/uL   Hemoglobin 13.1  12.0 - 15.0 g/dL   HCT 62.1  30.8 - 65.7 %   MCV 73.9 (*) 78.0 - 100.0 fL   MCH 23.6 (*) 26.0 - 34.0 pg   MCHC 32.0  30.0 - 36.0 g/dL   RDW 84.6 (*) 96.2 - 95.2 %   Platelets 263  150 - 400 K/uL  BASIC METABOLIC PANEL      Result Value Range   Sodium 136  135 - 145 mEq/L   Potassium 4.1  3.5 - 5.1 mEq/L   Chloride 102  96 - 112 mEq/L   CO2 25  19 - 32 mEq/L   Glucose, Bld 102 (*) 70 - 99 mg/dL   BUN 17  6 - 23 mg/dL   Creatinine, Ser 8.41  0.50 - 1.10 mg/dL   Calcium 9.8  8.4 - 32.4 mg/dL   GFR calc non Af Amer 72 (*) >90 mL/min   GFR calc Af Amer  83 (*) >90 mL/min  HCG, SERUM, QUALITATIVE      Result Value Range   Preg, Serum NEGATIVE  NEGATIVE    Dg Abd 1 View  02/24/2013  *RADIOLOGY REPORT*  Clinical Data: Postoperative Lap Band placement  ABDOMEN - 1 VIEW  Comparison: None.  Findings: A lap band is positioned at the gastric cardia level. Bowel gas pattern appears normal.  No obstruction or free air is seen on this supine examination.  There are phleboliths in the pelvis.  There is moderate stool in the colon.  IMPRESSION: Lap band at gastric cardia level.  Nonspecific gas pattern.   Original Report Authenticated By: Bretta Bang, M.D.    Discharge Exam:  Filed Vitals:   02/24/13 1834  BP: 145/75  Pulse: 83  Temp: 98.6 F (37 C)  Resp: 20   General: WN obese AA F who is alert and generally healthy appearing.  Lungs: Clear to auscultation and symmetric breath sounds. Heart:  RRR. No murmur or rub. Abdomen: Soft. No mass. Incisions look good.  BS present.  Discharge  Medications:     Medication List    TAKE these medications       loratadine 10 MG tablet  Commonly known as:  CLARITIN  Take 10 mg by mouth daily.     norethindrone-ethinyl estradiol 1-20 MG-MCG tablet  Commonly known as:  JUNEL FE,GILDESS FE,LOESTRIN FE  Take 1 tablet by mouth daily.     One Daily Womens Tabs  Take 1 tablet by mouth daily.     oxyCODONE-acetaminophen 5-325 MG/5ML solution  Commonly known as:  ROXICET  Take 5-10 mLs by mouth every 4 (four) hours as needed.     phentermine 37.5 MG tablet  Commonly known as:  ADIPEX-P  Take 37.5 mg by mouth daily before breakfast.       Disposition: 01-Home or Self Care      Discharge Orders   Future Appointments Provider Department Dept Phone   03/09/2013 4:00 PM [facility] Post-Op Class [facility] [phone]   03/11/2013 3:30 PM Kandis Cocking, MD [facility], [place] [phone]   Future Orders Complete By Expires     Increase activity  slowly  As directed       [facility] - DISCHARGE INSTRUCTIONS TO PATIENT  Return to work on:  You determine  Activity:  Driving - May drive in one or two days, if doing well   Lifting - No lifting >15 pounds for one week, then no limit  Wound Care:   May shower starting tomorrow  Diet:  Post op lap band diet  Follow up appointment:  You have an appt for 5/15. Call Dr. Allene Pyo office [facility]) at [phone] for any question.  Medications and dosages:  Resume your home medications.  You have a prescription for:  Roxicet  Signed: Ovidio Kin, M.D., [facility]  02/24/2013, 7:03 PM

## 2013-02-24 NOTE — Care Management Note (Signed)
    Page 1 of 1   02/24/2013     10:58:14 AM   CARE MANAGEMENT NOTE 02/24/2013  Patient:  [name], [name]   Account Number:  [id number]  Date Initiated:  02/24/2013  Documentation initiated by:  Lorenda Ishihara  Subjective/Objective Assessment:   37 yo female admitted s/p lap band procedure. PTA lived at home with family.     Action/Plan:   home when stable   Anticipated DC Date:  02/25/2013   Anticipated DC Plan:  HOME/SELF CARE      DC Planning Services  CM consult      Choice offered to / List presented to:             Status of service:  Completed, signed off Medicare Important Message given?   (If response is "NO", the following Medicare IM given date fields will be blank) Date Medicare IM given:   Date Additional Medicare IM given:    Discharge Disposition:  HOME/SELF CARE  Per UR Regulation:  Reviewed for med. necessity/level of care/duration of stay  If discussed at Long Length of Stay Meetings, dates discussed:    Comments:

## 2013-02-24 NOTE — Anesthesia Postprocedure Evaluation (Signed)
  Anesthesia Post-op Note  Patient: [name]  Procedure(s) Performed: Procedure(s) (LRB): LAPAROSCOPIC GASTRIC BANDING (N/A)  Patient Location: PACU  Anesthesia Type: General  Level of Consciousness: awake and alert   Airway and Oxygen Therapy: Patient Spontanous Breathing  Post-op Pain: mild  Post-op Assessment: Post-op Vital signs reviewed, Patient's Cardiovascular Status Stable, Respiratory Function Stable, Patent Airway and No signs of Nausea or vomiting  Last Vitals:  Filed Vitals:   02/24/13 0143  BP: 156/90  Pulse: 93  Temp: 37 C  Resp: 20    Post-op Vital Signs: stable   Complications: No apparent anesthesia complications

## 2013-02-24 NOTE — Progress Notes (Signed)
General Surgery Note  LOS: 1 day  POD -   1 Day Post-Op Room - 1534  Assessment/Plan: 1.  Morbid obesity  LAPAROSCOPIC GASTRIC BANDING, APS - D. Teasia Zapf - 02/24/2013  Dry heaves this AM - will leave on water and not advance until cleared.  KUB pending.  Will probably need to stay another day   2. History of right lung/cardiac cyst - 2004 - Dr. Delrae Alfred 3. DVT prophylaxis - SQ Heparin  Subjective:  Not too sore, but dry heaves this AM.  Took ice last PM better.  Discussed about not advancing diet until water tolerated.  Objective:   Filed Vitals:   02/24/13 0528  BP: 144/82  Pulse: 77  Temp: 98.6 F (37 C)  Resp: 16     Intake/Output from previous day:  04/29 0701 - 04/30 0700 In: 1300 [I.V.:1300] Out: 350 [Urine:350]  Intake/Output this shift:      Physical Exam:   General: WN obese AA F who is alert and oriented.    HEENT: Normal. Pupils equal. .   Lungs: Clear   Abdomen: Soft.  Rare BS   Wound: Clean.   Lab Results:   No results found for this basename: WBC, HGB, HCT, PLT,  in the last 72 hours  BMET  No results found for this basename: NA, K, CL, CO2, GLUCOSE, BUN, CREATININE, CALCIUM,  in the last 72 hours  PT/INR  No results found for this basename: LABPROT, INR,  in the last 72 hours  ABG  No results found for this basename: PHART, PCO2, PO2, HCO3,  in the last 72 hours   Studies/Results:  No results found.   Anti-infectives:   Anti-infectives   Start     Dose/Rate Route Frequency Ordered Stop   02/23/13 1330  ceFAZolin (ANCEF) 3 g in dextrose 5 % 50 mL IVPB     3 g 160 mL/hr over 30 Minutes Intravenous On call to O.R. 02/23/13 1316 02/23/13 1754      Ovidio Kin, MD, FACS Pager: [phone],   [facility] [place] [facility] Office: [phone] 02/24/2013

## 2013-03-03 ENCOUNTER — Telehealth (INDEPENDENT_AMBULATORY_CARE_PROVIDER_SITE_OTHER): Payer: Self-pay | Admitting: General Surgery

## 2013-03-03 NOTE — Telephone Encounter (Signed)
Pt had recent lap band surgery and calling for pain med refill.  Lortab elixir 7.5 mg/ 500 mg/ 15 ml, # 600 ml, 15 ml Q4-6H prn pain, no refill called to [facility]:  [phone].

## 2013-03-11 ENCOUNTER — Ambulatory Visit (INDEPENDENT_AMBULATORY_CARE_PROVIDER_SITE_OTHER): Payer: 59 | Admitting: Surgery

## 2013-03-11 ENCOUNTER — Encounter (INDEPENDENT_AMBULATORY_CARE_PROVIDER_SITE_OTHER): Payer: Self-pay | Admitting: Surgery

## 2013-03-11 VITALS — BP 128/88 | HR 72 | Temp 97.4°F | Resp 18 | Ht 69.0 in | Wt 275.8 lb

## 2013-03-11 DIAGNOSIS — Z9884 Bariatric surgery status: Secondary | ICD-10-CM

## 2013-03-11 NOTE — Progress Notes (Signed)
Re:   [name] DOB:   Nov 13, 1975 MRN:   [id number]  ASSESSMENT AND PLAN: 1. Lap Band, APS - D. Sacheen Arrasmith - 02/23/2013  Initial weight - 288, BMI - 42.1  Doing well.  She is hungry.  She will see me back in 6 to 8 weeks.  2.  Right lung/cardiac cyst - 2004 - Dr. Delrae Alfred 3.  She had A. Fib post lung surgery - this resolved after 1 month.  Chief Complaint  Patient presents with  . Lap Band Fill   REFERRING PHYSICIAN: Thora Lance, MD  HISTORY OF PRESENT ILLNESS: [name] is a 37 y.o. (DOB: 1976/01/23)  AA female whose primary care physician is Thora Lance, MD and comes to me today for follow up of lap band.  She has done well.  We talked about diet and expectations.  We talked about weight loss expectations (down to a BMI of 30).  And we talked about exercise expectations.  Bariatric history: The patient went to the information session given by Dr. Johna Sheriff.  She has tried multiple diets including low-carb, low calorie, and Atkins diet. She has had the most success with Atkins diet where she lost about 70 pounds.   She has tried Phentermine. She does not know anybody has had bariatric surgery.  (But she later admitted that her mother had gastric stapling in the 1980's.)  Discussed with her the bariatric support group.   Past Medical History  Diagnosis Date  . Heart murmur   . Morbid obesity   . Dysrhythmia     HISTORY OF AFIB X 2 MONTHS 2004 - RESOLVED 1 MONTH POST BRONCHIOGENIC CYST REMOVAL  . Environmental allergies   . History of transfusion   . Anemia     Current Outpatient Prescriptions  Medication Sig Dispense Refill  . acetaminophen (TYLENOL) 160 MG/5ML liquid Take by mouth every 4 (four) hours as needed for fever.      . calcium acetate, Phos Binder, (PHOSLYRA) 667 MG/5ML SOLN Take by mouth 3 (three) times daily with meals.      Marland Kitchen loratadine (CLARITIN) 10 MG tablet Take 10 mg by mouth daily.      . Multiple Vitamins-Minerals (ONE DAILY WOMENS)  TABS Take 1 tablet by mouth daily.      . norethindrone-ethinyl estradiol (JUNEL FE,GILDESS FE,LOESTRIN FE) 1-20 MG-MCG tablet Take 1 tablet by mouth daily.      Marland Kitchen oxyCODONE-acetaminophen (ROXICET) 5-325 MG/5ML solution Take 5-10 mLs by mouth every 4 (four) hours as needed.  200 mL  0  . phentermine (ADIPEX-P) 37.5 MG tablet Take 37.5 mg by mouth daily before breakfast.       No current facility-administered medications for this visit.      Allergies  Allergen Reactions  . Tobramycin Other (See Comments)    Swelling and redness    REVIEW OF SYSTEMS: Skin:  No history of rash.  No history of abnormal moles. Infection:  No history of hepatitis or HIV.  No history of MRSA. Neurologic:  No history of stroke.  No history of seizure.  No history of headaches. Cardiac:  No history of hypertension. No history of heart disease.  No history of prior cardiac catheterization.  No history of seeing a cardiologist. Pulmonary:  Right lung/cardiac cyst right chest - Dr. Edwyna Shell - 2004  Endocrine:  No diabetes. No thyroid disease. Gastrointestinal:  No history of stomach disease.  No history of liver disease.  No history of gall bladder disease.  No history  of pancreas disease.  No history of colon disease. Urologic:  No history of kidney stones.  No history of bladder infections. GYN:  She is on her period now. Musculoskeletal:  No history of joint or back disease. Hematologic:  No bleeding disorder.  No history of anemia.  Not anticoagulated. Psycho-social:  The patient is oriented.   The patient has no obvious psychologic or social impairment to understanding our conversation and plan.  SOCIAL and FAMILY HISTORY: Separated. Has twin boys, 39 yo. 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  She lives in [place], [place], 3 hours away.  Her mother had a gastric stapling.  PHYSICAL EXAM: BP 128/88  Pulse 72  Temp(Src) 97.4 F (36.3 C)  Resp 18  Ht 5\' 9"  (1.753  m)  Wt 275 lb 12.8 oz (125.102 kg)  BMI 40.71 kg/m2  LMP 02/15/2013  General: AA obese F who is alert and generally healthy appearing.  Lungs: Clear to auscultation and symmetric breath sounds.  Right thoracotomy incision. Heart:  RRR. No murmur or rub. Abdomen: Soft. No mass. No tenderness. No hernia. Normal bowel sounds. She is more pear than apple.  Incisions looks good.  DATA REVIEWED: Notes.  Ovidio Kin, MD,  [facility] [address] [place]  [address]   [place], [place]    [address] Phone:  [phone] FAX:  [phone]

## 2013-03-24 ENCOUNTER — Telehealth (INDEPENDENT_AMBULATORY_CARE_PROVIDER_SITE_OTHER): Payer: Self-pay | Admitting: General Surgery

## 2013-03-24 ENCOUNTER — Telehealth (INDEPENDENT_AMBULATORY_CARE_PROVIDER_SITE_OTHER): Payer: Self-pay

## 2013-03-24 NOTE — Telephone Encounter (Signed)
Patient calling status post lap band placement on 02/23/2013. She is complaining of approximately two weeks of gas pains in her chest. She was trying a liquid antacid but states this stopped working. She is feeling a lot of bloating/gas in her chest. She states this doesn't feel like reflux and she is having no trouble eating or drinking and no problems with night cough. She is having no problems with abdominal pain. She is belching. She is asking if there is something else she can take to help with this. Please advise.

## 2013-03-24 NOTE — Telephone Encounter (Signed)
Message given to Dr Daphine Deutscher- he called and spoke with patient.

## 2013-03-24 NOTE — Telephone Encounter (Signed)
Patient states she has more pressure in the chest after eating  and she does eat prior to going to bed. Advised patient to eat smaller bites. To take breaks after each bite . She may want to avoid watching T.V while eating thus she can focus on the amount of food intake. Advised to not eat 2-3 hrs before lying down. She denies having any jaw pain, shoulder pain, shortness of breath. Expressed for her to go to the ER /call  911 if she experiences symptoms . Verbalized understanding. She will call if these symptom continues

## 2013-03-30 ENCOUNTER — Encounter: Payer: BC Managed Care – PPO | Attending: Surgery

## 2013-03-30 DIAGNOSIS — Z713 Dietary counseling and surveillance: Secondary | ICD-10-CM | POA: Insufficient documentation

## 2013-03-30 DIAGNOSIS — Z01818 Encounter for other preprocedural examination: Secondary | ICD-10-CM | POA: Insufficient documentation

## 2013-04-23 ENCOUNTER — Ambulatory Visit (INDEPENDENT_AMBULATORY_CARE_PROVIDER_SITE_OTHER): Payer: BC Managed Care – PPO | Admitting: Surgery

## 2013-04-23 ENCOUNTER — Encounter (INDEPENDENT_AMBULATORY_CARE_PROVIDER_SITE_OTHER): Payer: Self-pay | Admitting: Surgery

## 2013-04-23 DIAGNOSIS — Z9884 Bariatric surgery status: Secondary | ICD-10-CM

## 2013-04-23 NOTE — Progress Notes (Signed)
Re:   [name] DOB:   12-Aug-1976 MRN:   [id number]  ASSESSMENT AND PLAN: 1. Lap Band, APS - [name] - 02/23/2013  Initial weight - 288, BMI - 42.1  Doing well, though she says she is hungry.  I did a lap band fill to 4.0 cc.  She will see me back in 8 weeks.  2.  Right lung/cardiac cyst - 2004 - [name] 3.  She had A. Fib post lung surgery - this resolved after 1 month.  Chief Complaint  Patient presents with  . Lap Band Fill   REFERRING PHYSICIAN: Thora Lance, MD  HISTORY OF PRESENT ILLNESS: [name] is a 37 y.o. (DOB: 25-Aug-1976)  AA female whose primary care physician is Bridget Lance, MD and comes to me today for follow up of lap band. She had some intermittent gas pains in her chest and she called the office 03/24/2013.  But she treated this with Mylanta, and it has improved and is all gone.  She has done well.  She's walking about 1 1/2 miles at a time.  She knows that she needs to increase her physical activity.  But she is hungry and feels no resistance.  Whatever caused her chest pain has resolved.  Bariatric history: The patient went to the information session given by [name].  She has tried multiple diets including low-carb, low calorie, and Atkins diet. She has had the most success with Atkins diet where she lost about 70 pounds.   She has tried Phentermine. She does not know anybody has had bariatric surgery.  (But she later admitted that her mother had gastric stapling in the 1980's.)  I discussed with her about visiting the bariatric support group.   Past Medical History  Diagnosis Date  . Heart murmur   . Morbid obesity   . Dysrhythmia     HISTORY OF AFIB X 2 MONTHS 2004 - RESOLVED 1 MONTH POST BRONCHIOGENIC CYST REMOVAL  . Environmental allergies   . History of transfusion   . Anemia     Current Outpatient Prescriptions  Medication Sig Dispense Refill  . acetaminophen (TYLENOL) 160 MG/5ML liquid Take by mouth every 4  (four) hours as needed for fever.      . calcium acetate, Phos Binder, (PHOSLYRA) 667 MG/5ML SOLN Take by mouth 3 (three) times daily with meals.      . cyclobenzaprine (FLEXERIL) 10 MG tablet Take 10 mg by mouth 3 (three) times daily as needed for muscle spasms.      Marland Kitchen loratadine (CLARITIN) 10 MG tablet Take 10 mg by mouth daily.      . Multiple Vitamins-Minerals (ONE DAILY WOMENS) TABS Take 1 tablet by mouth daily.      . naproxen (NAPROSYN) 500 MG tablet Take 500 mg by mouth 2 (two) times daily with a meal.      . norethindrone-ethinyl estradiol (JUNEL FE,GILDESS FE,LOESTRIN FE) 1-20 MG-MCG tablet Take 1 tablet by mouth daily.      Marland Kitchen oxyCODONE-acetaminophen (ROXICET) 5-325 MG/5ML solution Take 5-10 mLs by mouth every 4 (four) hours as needed.  200 mL  0  . phentermine (ADIPEX-P) 37.5 MG tablet Take 37.5 mg by mouth daily before breakfast.       No current facility-administered medications for this visit.      Allergies  Allergen Reactions  . Tobramycin Other (See Comments)    Swelling and redness    REVIEW OF SYSTEMS: Pulmonary:  Right lung/cardiac cyst right chest -  [name] - 2004  SOCIAL and FAMILY HISTORY: Separated. Has twin boys, 39 yo. 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  She lives in [place], [place], 3 hours away.  Her mother had a gastric stapling.  PHYSICAL EXAM: BP 130/90  Pulse 104  Resp 14  Ht 5' 8.5" (1.74 m)  Wt 267 lb 12.8 oz (121.473 kg)  BMI 40.12 kg/m2  General: AA obese F who is alert and generally healthy appearing.  Lungs: Clear to auscultation and symmetric breath sounds.  Right thoracotomy incision. Heart:  RRR. No murmur or rub. Abdomen: Soft. No mass. No tenderness.   Incisions looks good.  Procedure:  While in the office, I accessed her lap band.  She had 1.5 cc, I added 3.5 cc, for a total of 5.0 cc.  But she was too tight, so I removed 1.0 cc for a total of 4.0 cc.  It certainly looks like the upper  limit of her lap band is about 5.0 cc.  DATA REVIEWED: Notes.  Bridget Kin, MD,  [facility] [address] [place]  [address]   [place], [place]    [address] Phone:  [phone] FAX:  [phone]

## 2013-05-18 ENCOUNTER — Encounter: Payer: Self-pay | Admitting: *Deleted

## 2013-05-18 ENCOUNTER — Encounter: Payer: BC Managed Care – PPO | Attending: Surgery | Admitting: *Deleted

## 2013-05-18 DIAGNOSIS — Z01818 Encounter for other preprocedural examination: Secondary | ICD-10-CM | POA: Insufficient documentation

## 2013-05-18 DIAGNOSIS — Z713 Dietary counseling and surveillance: Secondary | ICD-10-CM | POA: Insufficient documentation

## 2013-05-18 NOTE — Progress Notes (Signed)
  Follow-up visit:  12 Weeks Post-Operative LAGB Surgery  Medical Nutrition Therapy:  Appt start time: 1500   End time:  1530.  Primary concerns today: Post-operative Bariatric Surgery Nutrition Management Tykera returns with an additional 25 lb weight loss.  Reports she "slimes" after eating most meats and seafood. Using her son's plate and silverware to get slime to stop. Tried slowing down and chewing more, but it happens no matter how long she takes to eat. Eating excessive CHO at times via Jello No Bake Cheesecake in 1/3 c portions (~30-35 g CHO). Also admits she eats too fast and trying to slow down. Just had her assessment for the BELT program.  TANITA  BODY COMP RESULTS  02/11/13 05/18/13   BMI (kg/m^2) 42.2 39.0   Fat Mass (lbs) 139.5 119.5   Fat Free Mass (lbs) 146.0 141.0   Total Body Water (lbs) 107.0 103.0   24-hr recall: B (AM): 3 oz baked salmon - 20g Snk (AM): Mini cheesecake - no bake jello cheesecake - 5g L (PM): 4 oz baked salmon - 25g Snk (PM): (2) No bake jello cheesecake svgs (1/3 cup ea) - 10g D (PM): 3 med grilled shrimp, 2 pcs broccoli, 1 slice water chestnut - 3g Snk (PM): NONE  Fluid intake: 60+ oz Estimated total protein intake: 60-65 g  Medications: No longer taking Roxicet or Flexeril Supplementation: Taking regularly  Using straws: No Drinking while eating: Occasionally, but takes sips sometimes when slime starts Hair loss: Mild Carbonated beverages: No N/V/D/C: Clear slime after eating salmon, chicken, and more than 2 bites of red meat.  Last Lap-Band fill: 04/23/13; Feels "very close to green"  Recent physical activity:  Just started BELT program. 3 days/week  Progress Towards Goal(s):  In progress.  Handouts given during visit include:  Phase 3B: High Protein + Non-Starchy Vegetables   Nutritional Diagnosis:  [place]-3.3 Overweight/obesity related to past poor dietary habits and physical inactivity as evidenced by patient w/ recent LAGB surgery  following dietary guidelines for continued weight loss.  Intervention:  Nutrition education/diet advancement.  Monitoring/Evaluation:  Dietary intake, exercise, lap band fills, and body weight. Follow up in 6 weeks for 4.5 month post-op visit.

## 2013-05-18 NOTE — Patient Instructions (Signed)
Goals:  Follow Phase 3B: High Protein + Non-Starchy Vegetables  Eat 3-6 small meals/snacks, every 3-5 hrs  Increase lean protein foods to meet 60-80g goal  Increase fluid intake to 64oz +  Add 15 grams of carbohydrate (fruit, whole grain, starchy vegetable) with meals  Avoid drinking 15 minutes before, during and 30 minutes after eating  Aim for >30 min of physical activity daily  

## 2013-05-24 NOTE — Progress Notes (Signed)
Bariatric Class:  Appt start time: 1600 end time:  1700.  2 Week Post-Operative Nutrition Class  Patient was seen on 03/30/13 for Post-Operative Nutrition education at [facility].   Surgery date: 02/23/13 Surgery type: LAGB  Weight today: TBA Weight change: TBA Total weight lost: TBA BMI: TBA  The following the learning objectives were met by the patient during this course:  Identifies Phase 3A (Soft, High Proteins) Dietary Goals and will begin from 2 weeks post-operatively to 2 months post-operatively  Identifies appropriate sources of fluids and proteins   States protein recommendations and appropriate sources post-operatively  Identifies the need for appropriate texture modifications, mastication, and bite sizes when consuming solids  Identifies appropriate multivitamin and calcium sources post-operatively  Describes the need for physical activity post-operatively and will follow MD recommendations  States when to call healthcare provider regarding medication questions or post-operative complications  Handouts given during class include:  Phase 3A: Soft, High Protein Diet Handout  Band Fill Guidelines Handout  Follow-Up Plan: Patient will follow-up at [facility] in 6 weeks for 2 months post-op nutrition visit for diet advancement per MD.

## 2013-05-27 NOTE — Patient Instructions (Signed)
Patient to follow Phase 3A-Soft, High Protein Diet and follow-up at [facility] in 6 weeks for 2 months post-op nutrition visit for diet advancement. 

## 2013-06-16 ENCOUNTER — Ambulatory Visit (INDEPENDENT_AMBULATORY_CARE_PROVIDER_SITE_OTHER): Payer: BC Managed Care – PPO | Admitting: Surgery

## 2013-06-16 ENCOUNTER — Encounter (INDEPENDENT_AMBULATORY_CARE_PROVIDER_SITE_OTHER): Payer: Self-pay | Admitting: Surgery

## 2013-06-16 DIAGNOSIS — Z9884 Bariatric surgery status: Secondary | ICD-10-CM

## 2013-06-16 NOTE — Progress Notes (Signed)
Re:   [name] DOB:   1976/10/19 MRN:   [id number]  ASSESSMENT AND PLAN: 1. Lap Band, APS - D. Teondra Newburg - 02/23/2013  Initial weight - 288, BMI - 42.1  She has 4.0 cc in her lap band.  I did not adjust her today.  She will see me back in 8 weeks.  2.  Right lung/cardiac cyst - 2004 - Dr. Delrae Alfred 3.  She had A. Fib post lung surgery - this resolved after 1 month. 4.  Feels like she has no energy.  Chief Complaint  Patient presents with  . Lap Band Fill   REFERRING PHYSICIAN: Thora Lance, MD  HISTORY OF PRESENT ILLNESS: [name] is a 37 y.o. (DOB: 12-04-1975)  AA female whose primary care physician is Thora Lance, MD and comes to me today for follow up of lap band.  She has done well.  She has lost weight, but she says that she has not lost any weight in the last 4 weeks.  She does feel resistance from the lab band. She also feels like she has no energy She has started the BELT program.  But she is not walking as much. Bariatric history: The patient went to the information session given by Dr. Johna Sheriff.  She has tried multiple diets including low-carb, low calorie, and Atkins diet. She has had the most success with Atkins diet where she lost about 70 pounds.   She has tried Phentermine. She does not know anybody has had bariatric surgery.  (But she later admitted that her mother had gastric stapling in the 1980's.)  I discussed with her about visiting the bariatric support group.   Past Medical History  Diagnosis Date  . Heart murmur   . Morbid obesity   . Dysrhythmia     HISTORY OF AFIB X 2 MONTHS 2004 - RESOLVED 1 MONTH POST BRONCHIOGENIC CYST REMOVAL  . Environmental allergies   . History of transfusion   . Anemia     Current Outpatient Prescriptions  Medication Sig Dispense Refill  . Biotin 5000 MCG TABS Take by mouth.      . Calcium Citrate-Vitamin D (CALCIUM CITRATE + D3 PO) Take 500 mg by mouth 3 (three) times daily.      . Cyanocobalamin  (VITAMIN B-12) 1000 MCG SUBL Place under the tongue.      . loratadine (CLARITIN) 10 MG tablet Take 10 mg by mouth daily.      . Multiple Vitamins-Minerals (ONE DAILY WOMENS) TABS Take 1 tablet by mouth daily.      . norethindrone-ethinyl estradiol (JUNEL FE,GILDESS FE,LOESTRIN FE) 1-20 MG-MCG tablet Take 1 tablet by mouth daily.      . Triamcinolone Acetonide 0.025 % LOTN        No current facility-administered medications for this visit.      Allergies  Allergen Reactions  . Tobramycin Other (See Comments)    Swelling and redness    REVIEW OF SYSTEMS: Pulmonary:  Right lung/cardiac cyst right chest - Dr. Edwyna Shell - 2004  SOCIAL and FAMILY HISTORY: Separated. Has twin boys, 39 yo. 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  She lives in [place], [place], 3 hours away.  Her mother had a gastric stapling.  PHYSICAL EXAM: BP 120/78  Pulse 74  Resp 16  Ht 5\' 9"  (1.753 m)  Wt 259 lb 3.2 oz (117.572 kg)  BMI 38.26 kg/m2  General: AA obese F who is alert  and generally healthy appearing.  Lungs: Clear to auscultation and symmetric breath sounds.  Right thoracotomy incision. Heart:  RRR. No murmur or rub. Abdomen: Soft. No mass. No tenderness.   Incisions looks good. Port in RUQ looks good.  Procedure:  No change today  DATA REVIEWED: Notes.  Ovidio Kin, MD,  [facility] [address]  [address]   [place], [place]    [address] Phone:  [phone] FAX:  [phone]

## 2013-06-25 ENCOUNTER — Encounter: Payer: BC Managed Care – PPO | Attending: Surgery | Admitting: Dietician

## 2013-06-25 DIAGNOSIS — Z713 Dietary counseling and surveillance: Secondary | ICD-10-CM | POA: Insufficient documentation

## 2013-06-25 DIAGNOSIS — Z01818 Encounter for other preprocedural examination: Secondary | ICD-10-CM | POA: Insufficient documentation

## 2013-06-25 NOTE — Progress Notes (Signed)
  Follow-up visit:  12 Weeks Post-Operative LAGB Surgery  Medical Nutrition Therapy:  Appt start time: 1015  End time:  1045.  Primary concerns today: Post-operative Bariatric Surgery Nutrition Management [name] returns with today with no weight change but a 10.5 fat pound loss. States that her portion size might be too big, though she is eating less than she was before surgery. States that she wanted to have a fill but the doctor did not want to do a fill at this time. States that she is still "sliming" once per day when she eats too fast and too much.    TANITA  BODY COMP RESULTS  02/11/13 05/18/13 06/25/2013   BMI (kg/m^2) 42.2 39.0 39.0   Fat Mass (lbs) 139.5 119.5 109   Fat Free Mass (lbs) 146.0 141.0 151.0   Total Body Water (lbs) 107.0 103.0 110.0   24-hr recall: B (AM): protein shake and handful of sunflower seeds - 20g Snk (AM): sunflower seeds - 5g L (PM): 2 oz ham low carb wrap with 2 oz cheese, guacamole, tomato, lettuce OR lunchables - 28g Snk (PM): (2) protein shake and handful of sunflower seeds - 20g D (PM): 2 bag of mini chips and 3 oz fried fish, 4 french fries, 3 oz coleslaw, and a hush puppy  20-25g Snk (PM): NONE  Fluid intake: 60 oz Estimated total protein intake: 90-95 g  Medications: No longer taking Claritan  Supplementation: Taking regularly  Using straws: No Drinking while eating: Occasionally, but takes sips sometimes when food feels stuck Hair loss: No Carbonated beverages: No N/V/D/C: Clear slime after eating salmon, chicken, and more than 2 bites of red meat will use Miralax once per week if feeling "bloated" Last Lap-Band fill: 04/23/13; Feels "very close to green"  Recent physical activity:  BELT program. 3 days/week and walking 2-3 days/week for 20 minutes  Progress Towards Goal(s):  In progress.  Handouts given during visit include:  15g CHO snacks   Nutritional Diagnosis:  [place]-3.3 Overweight/obesity related to past poor dietary habits and  physical inactivity as evidenced by patient w/ recent LAGB surgery following dietary guidelines for continued weight loss.  Intervention:  Nutrition education/diet advancement. Recommended adding more non-starchy vegetables to meals if she is still hungry, limiting excess fat in fried foods and chips, and working on eating foods slowly and chewing thoroughly.   Monitoring/Evaluation:  Dietary intake, exercise, lap band fills, and body weight. Follow up in 6 weeks for 6 month post-op visit.

## 2013-06-25 NOTE — Patient Instructions (Addendum)
Goals:  Follow Phase 3B: High Protein + Non-Starchy Vegetables  Eat 3-6 small meals/snacks, every 3-5 hrs  Increase lean protein foods to meet 60-80g goal  Increase fluid intake to 64oz +  Add 15 grams of carbohydrate ( whole grain) with meals  Continue avoiding drinking 15 minutes before, during and 30 minutes after eating  Continue getting 30 min of physical activity daily  Try having triscuits instead of chips at night   Add more (1) non starchy vegetables or (2) protein if you are still hungry at meals   Make your own "lunchables" with [place], low fat cheese, and triscuits  Continue trying to eat meals slowly and chew thoroughly

## 2013-06-29 ENCOUNTER — Ambulatory Visit: Payer: BC Managed Care – PPO | Admitting: *Deleted

## 2013-08-05 ENCOUNTER — Encounter (INDEPENDENT_AMBULATORY_CARE_PROVIDER_SITE_OTHER): Payer: BC Managed Care – PPO | Admitting: Surgery

## 2013-08-10 IMAGING — CR DG ABDOMEN 1V
2 series · 2 of 2 positions shown · non-contrast
Comparison: None.

CLINICAL DATA: Postoperative Lap Band placement

ABDOMEN - 1 VIEW

[t abdomen supine (1 of 2)]
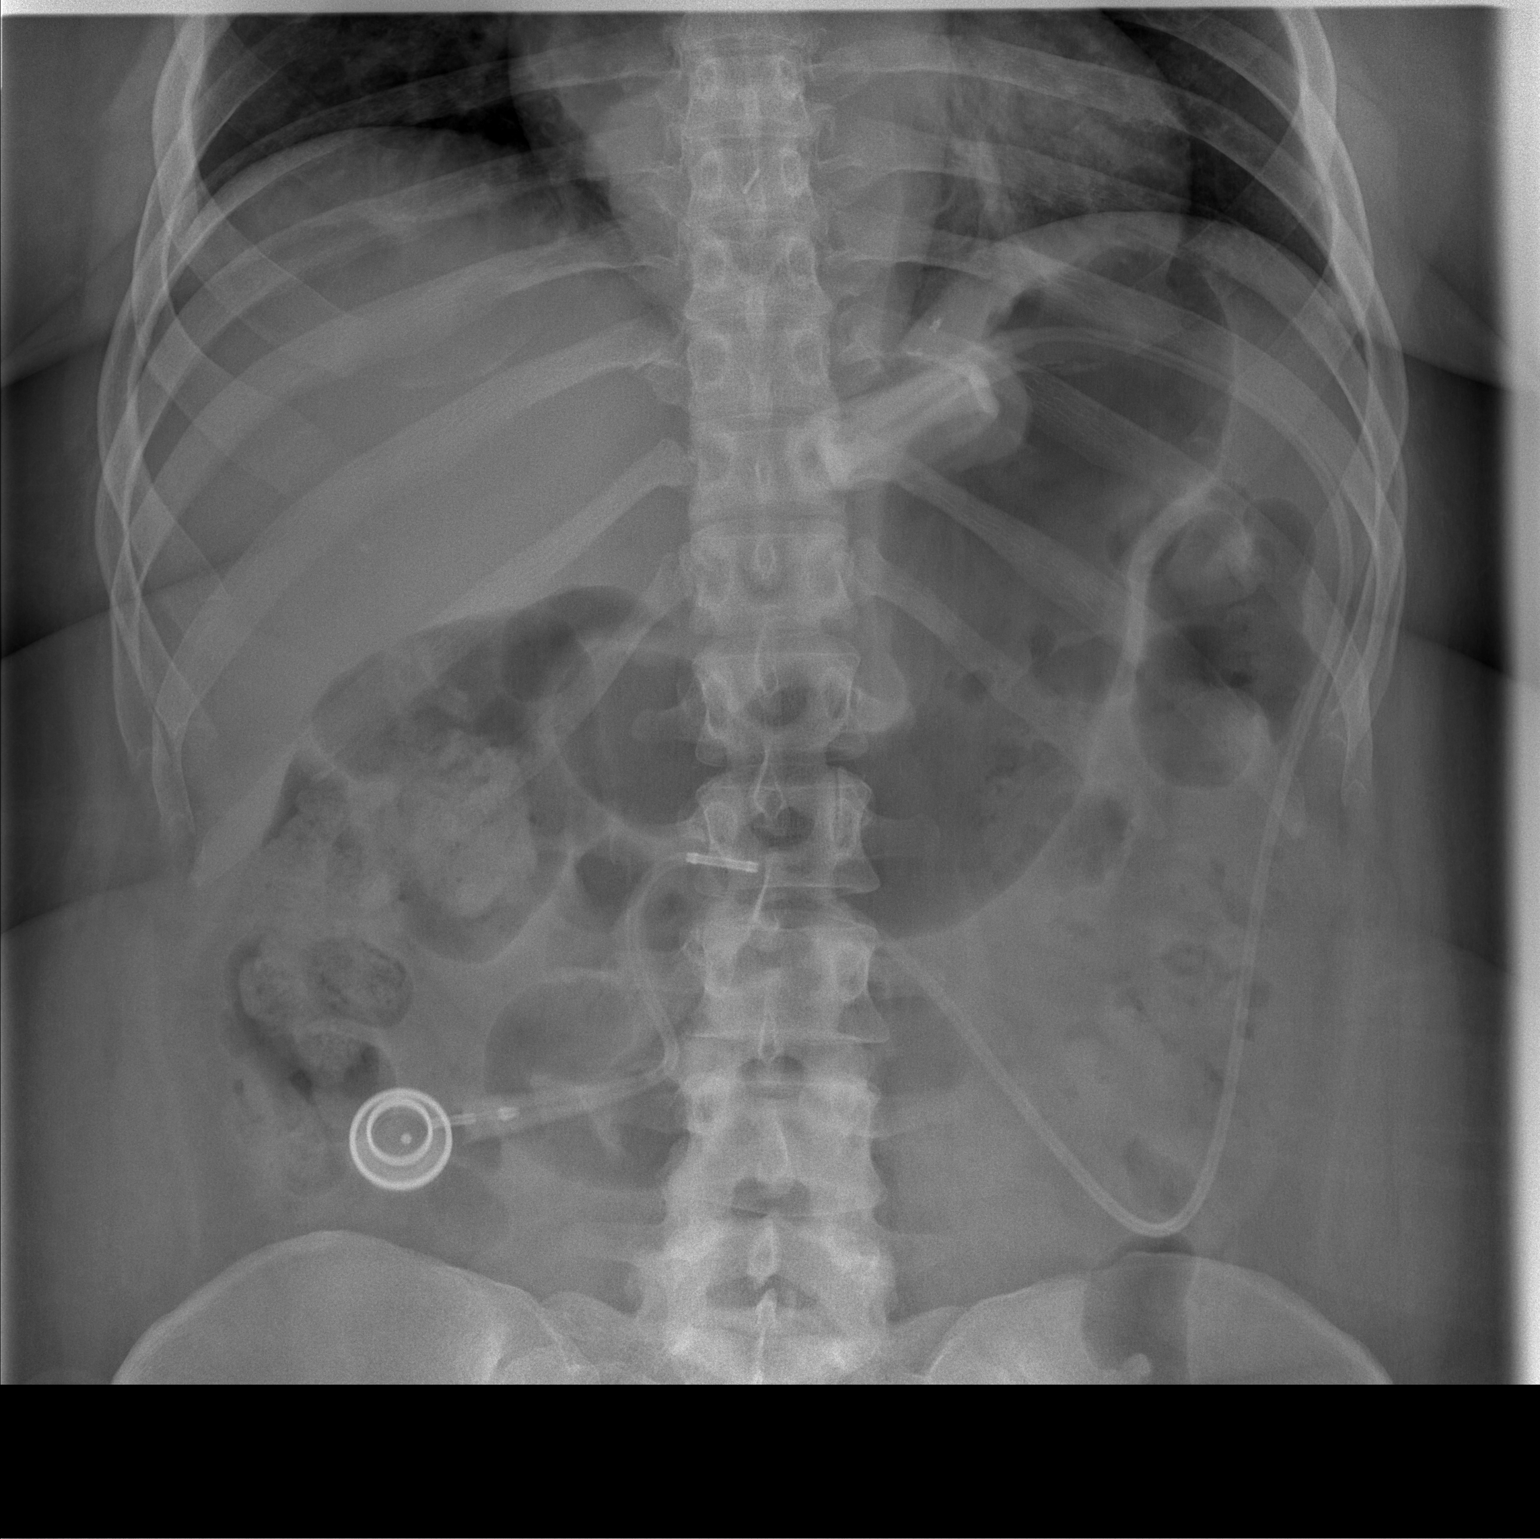

[t abdomen supine (2 of 2)]
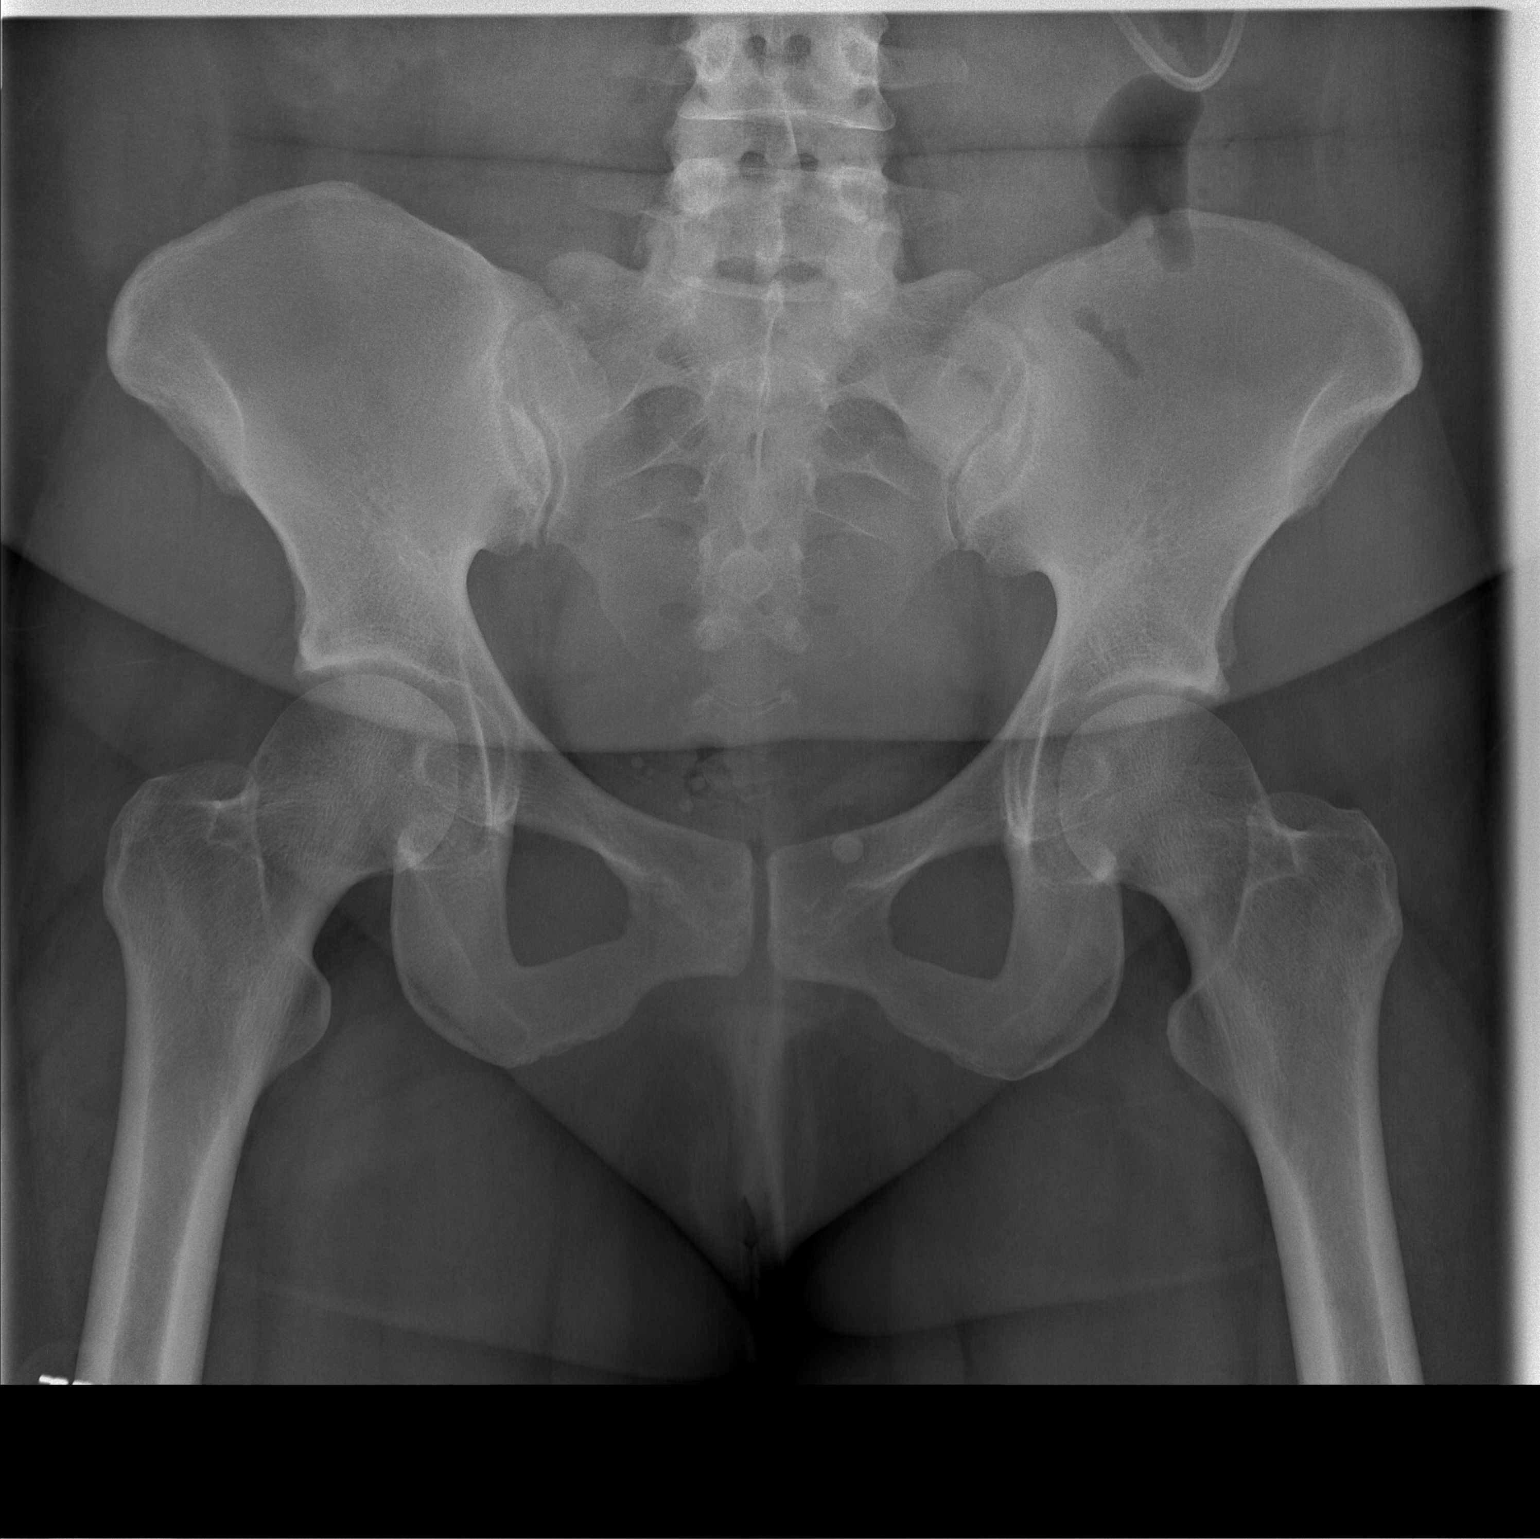

[2 of 2 positions shown; findings below may reference images not displayed]

FINDINGS: A lap band is positioned at the gastric cardia level.
Bowel gas pattern appears normal.  No obstruction or free air is
seen on this supine examination.  There are phleboliths in the
pelvis.  There is moderate stool in the colon.
IMPRESSION: Lap band at gastric cardia level.  Nonspecific gas
pattern.

## 2013-08-13 ENCOUNTER — Ambulatory Visit: Payer: BC Managed Care – PPO | Admitting: Dietician

## 2013-08-18 ENCOUNTER — Encounter (INDEPENDENT_AMBULATORY_CARE_PROVIDER_SITE_OTHER): Payer: Self-pay | Admitting: Surgery

## 2013-08-18 ENCOUNTER — Ambulatory Visit (INDEPENDENT_AMBULATORY_CARE_PROVIDER_SITE_OTHER): Payer: BC Managed Care – PPO | Admitting: Surgery

## 2013-08-18 VITALS — BP 133/90 | HR 74 | Temp 99.0°F | Resp 16 | Ht 69.0 in | Wt 251.0 lb

## 2013-08-18 DIAGNOSIS — Z6841 Body Mass Index (BMI) 40.0 and over, adult: Secondary | ICD-10-CM

## 2013-08-18 DIAGNOSIS — Z9884 Bariatric surgery status: Secondary | ICD-10-CM

## 2013-08-18 NOTE — Progress Notes (Addendum)
Re:   [name] DOB:   June 05, 1976 MRN:   [id number]  ASSESSMENT AND PLAN: 1. Lap Band, APS - D. Sirius Woodford - 02/23/2013  Initial weight - 288, BMI - 42.1  She has 4.0 cc in her lap band.  I did not adjust her today.  I gave her a prescription of Phentermine (37.5 mg) - #30 with 1 refill.  She takes this on average twice per week.  It appears to be working well for her to suppress her appetite on certain days.  She will see me back in 3 months. [I filled out a Phentermine renewal - #30 - 11/04/2013] [I filled out a Phentermine renewal - #30 (1 refill) - DN 01/24/2014]  2.  Right lung/cardiac cyst - 2004 - Dr. Delrae Alfred 3.  She had A. Fib post lung surgery - this resolved after 1 month. 4.  Feels like she has no energy.  Chief Complaint  Patient presents with  . Lap Band Fill   REFERRING PHYSICIAN: Thora Lance, MD  HISTORY OF PRESENT ILLNESS: [name] is a 37 y.o. (DOB: Sep 14, 1976)  AA female whose primary care physician is Thora Lance, MD and comes to me today for follow up of lap band.  She continues to do well.  She is down 8 more pounds.  She feels resistance and does not think that she needs a fill. She finished the BELT program - she is looking for another avenue of exercise.  We discussed this. She is also taking Phentermine a couple of times per week.  This seems to suppress her appetite when she is really hungry.  I have had several patients who have used diet pills with surgery for success.  She is also only taking the phentermine once or twice/week.  Bariatric history: The patient went to the information session given by Dr. Johna Sheriff.  She has tried multiple diets including low-carb, low calorie, and Atkins diet. She has had the most success with Atkins diet where she lost about 70 pounds.   She has tried Phentermine. She does not know anybody has had bariatric surgery.  (But she later admitted that her mother had gastric stapling in the 1980's.)  I discussed  with her about visiting the bariatric support group.   Past Medical History  Diagnosis Date  . Heart murmur   . Morbid obesity   . Dysrhythmia     HISTORY OF AFIB X 2 MONTHS 2004 - RESOLVED 1 MONTH POST BRONCHIOGENIC CYST REMOVAL  . Environmental allergies   . History of transfusion   . Anemia     Current Outpatient Prescriptions  Medication Sig Dispense Refill  . Biotin 5000 MCG TABS Take by mouth.      . Calcium Citrate-Vitamin D (CALCIUM CITRATE + D3 PO) Take 500 mg by mouth 3 (three) times daily.      . Cyanocobalamin (VITAMIN B-12) 1000 MCG SUBL Place under the tongue.      . loratadine (CLARITIN) 10 MG tablet Take 10 mg by mouth daily.      . Multiple Vitamins-Minerals (ONE DAILY WOMENS) TABS Take 1 tablet by mouth daily.      . norethindrone-ethinyl estradiol (JUNEL FE,GILDESS FE,LOESTRIN FE) 1-20 MG-MCG tablet Take 1 tablet by mouth daily.      . phentermine 37.5 MG capsule Take 37.5 mg by mouth every morning.      . Triamcinolone Acetonide 0.025 % LOTN        No current facility-administered medications for this visit.  Allergies  Allergen Reactions  . Tobramycin Other (See Comments)    Swelling and redness    REVIEW OF SYSTEMS: Pulmonary:  Right lung/cardiac cyst right chest - Dr. Edwyna Shell - 2004  SOCIAL and FAMILY HISTORY: Separated. Has twin boys, 39 yo. 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  Her mother lives in [place], [place], 3 hours away.  Her mother had a gastric stapling.  PHYSICAL EXAM: BP 133/90  Pulse 74  Temp(Src) 99 F (37.2 C) (Oral)  Resp 16  Ht 5\' 9"  (1.753 m)  Wt 251 lb (113.853 kg)  BMI 37.05 kg/m2  General: AA obese F who is alert and generally healthy appearing.  Lungs: Clear to auscultation and symmetric breath sounds.  Right thoracotomy incision. Heart:  RRR. No murmur or rub. Abdomen: Soft. No mass. No tenderness.  Incisions looks good. Port in RUQ looks good.  Procedure:  No change  today  DATA REVIEWED: Notes.  Ovidio Kin, MD,  [facility] [address]  [address]   [place], [place]    [address] Phone:  [phone] FAX:  [phone]

## 2013-08-23 ENCOUNTER — Encounter: Payer: BC Managed Care – PPO | Attending: Surgery | Admitting: Dietician

## 2013-08-23 DIAGNOSIS — Z713 Dietary counseling and surveillance: Secondary | ICD-10-CM | POA: Insufficient documentation

## 2013-08-23 DIAGNOSIS — Z01818 Encounter for other preprocedural examination: Secondary | ICD-10-CM | POA: Insufficient documentation

## 2013-08-23 NOTE — Progress Notes (Signed)
  Follow-up visit:  6 Months Post-Operative LAGB Surgery  Medical Nutrition Therapy:  Appt start time: 1200  End time:  1230.  Primary concerns today: Post-operative Bariatric Surgery Nutrition Management [name] returns with today with an 8 pound weight loss. Having phentermine 1-2 x week. Eating yogurt or protein drinks before 11 AM, can't tolerate other foods at that time. Eating with that are high in fat and carbs, especially at lunchtime.   Surgery date: 02/23/13  Surgery type: LAGB  Weight today: 252.0 Weight change: 8 lbs, (2 lbs fat) [facility] weight at [facility]: 285 lbs Total weight lost: 33 lbs Weight Loss Goal: 180 lbs   TANITA  BODY COMP RESULTS  02/11/13 05/18/13 06/25/13 08/23/13   BMI (kg/m^2) 42.2 39.0 39.0 37.8   Fat Mass (lbs) 139.5 119.5 109 107.0   Fat Free Mass (lbs) 146.0 141.0 151.0 145.0   Total Body Water (lbs) 107.0 103.0 110.0 106.0   24-hr recall: B (7 AM): yogurt - 12g Snk (10 M): protein shake - 15g L (PM): Marie Calendar chicken pot pie or 1/2 steak burrito from [employer] 22g Snk (PM): protein shake 15 g or snack bags of chips D (PM):2-3 oz of protein or small green beans  14-21g Snk (PM): NONE  Fluid intake: 64+ oz Estimated total protein intake: 60-85 g  Medications: see list Supplementation: Taking regularly  Using straws: No Drinking while eating: No Hair loss: No Carbonated beverages: No N/V/D/C: Clear slime after eating salmon, chicken, and more than 2 bites of red meat will use Miralax once per week if feeling "bloated" Last Lap-Band fill: 04/23/13; Feels "very close to green"  Recent physical activity:  BELT program finished. walking 2  days/week for 20-30 minutes  Progress Towards Goal(s):  In progress.  Handouts given during visit include:  Phase III B Protein and Non Starchy Vegetables  Protein list from Phase III A   Nutritional Diagnosis:  -3.3 Overweight/obesity related to past poor dietary habits and physical inactivity as  evidenced by patient w/ recent LAGB surgery following dietary guidelines for continued weight loss.  Intervention:  Nutrition education/diet advancement. Recommended adding more non-starchy vegetables to meals if she is still hungry, limiting excess fat in fried foods and chips, and working on eating foods slowly and chewing thoroughly.   Monitoring/Evaluation:  Dietary intake, exercise, lap band fills, and body weight. Follow up in 3 months for 9 month post-op visit.

## 2013-08-23 NOTE — Patient Instructions (Addendum)
Goals:  Follow Phase 3B: High Protein + Non-Starchy Vegetables  Eat 3-6 small meals/snacks, every 3-5 hrs  Increase lean protein foods to meet 60-80g goal  Increase fluid intake to 64oz +  Add 15 grams of carbohydrate ( whole grain) with meals  Continue getting 30 min of physical activity daily  Add more (1) non starchy vegetables or (2) protein if you are still hungry at meals   Continue trying to eat meals slowly and chew thoroughly  Choose frozen meals with less than 300 calories, more than 14 grams of protein and mostly just protein and non-starchy vegetables.  Choose yogurt with less than 12 g of protein per serving.  Avoid high carb, high fat foods (chips, tortillas, or pie crusts)  At [employer] have meat with beans and vegetable with a little bit of cheese (no rice, sour cream, burrito)

## 2013-09-02 ENCOUNTER — Other Ambulatory Visit: Payer: Self-pay

## 2013-11-23 ENCOUNTER — Encounter: Payer: BC Managed Care – PPO | Attending: Surgery | Admitting: Dietician

## 2013-11-23 DIAGNOSIS — Z713 Dietary counseling and surveillance: Secondary | ICD-10-CM | POA: Insufficient documentation

## 2013-11-23 DIAGNOSIS — E669 Obesity, unspecified: Secondary | ICD-10-CM | POA: Insufficient documentation

## 2013-11-23 DIAGNOSIS — Z6836 Body mass index (BMI) 36.0-36.9, adult: Secondary | ICD-10-CM | POA: Insufficient documentation

## 2013-11-23 NOTE — Patient Instructions (Addendum)
Goals:  Follow Phase 3B: High Protein + Non-Starchy Vegetables  Eat 3-6 small meals/snacks, every 3-5 hrs  Aim to eat lean protein foods to meet 60-80g goal, limit protein shakes to 1 x day.  Increase fluid intake to 64oz +  Limit carbohydrates to 15 grams per meal  Aim to get 30 min of physical activity most days  Add more (1) non starchy vegetables or (2) protein if you are still hungry at meals   Continue trying to eat meals slowly and chew thoroughly  If you are craving chocolate, have one square of dark (70% or more cacao)

## 2013-11-23 NOTE — Progress Notes (Signed)
  Follow-up visit:  9 Months Post-Operative LAGB Surgery  Medical Nutrition Therapy:  Appt start time: 1610  End time:  12:45.  Primary concerns today: Post-operative Bariatric Surgery Nutrition Management [name] returns with today with an 8 pound weight loss. Trying to get back to normal after the holiday and on vacation ([place] Feeling very hungry at nighttime. Eating "a lot" of chocolate.   Surgery date: 02/23/13  Surgery type: LAGB  Weight today: 243.5 lbs  Weight change: 8.5 lbs, (3 lbs fat) Starting weight at [facility]: 285 lbs Total weight lost: 41.5 lbs Weight Loss Goal: 180 lbs   TANITA  BODY COMP RESULTS  02/11/13 05/18/13 06/25/13 08/23/13 11/23/13   BMI (kg/m^2) 42.2 39.0 39.0 37.8 36.5   Fat Mass (lbs) 139.5 119.5 109 107.0 104.0   Fat Free Mass (lbs) 146.0 141.0 151.0 145.0 139.5   Total Body Water (lbs) 107.0 103.0 110.0 106.0 102.0   24-hr recall: B (7 AM): yogurt or premier protein shake- 12-30g Snk (10 M): yogurt or 6 oz protein L (PM):  6 in sub from [employer] (PM): leftover from lunch or premier protein 30 g D (PM) leftover from lunch 14-21g Snk (PM): popcorn, candy bar  Fluid intake: 30 oz of water, 22 oz protein shakes (55 oz)  Estimated total protein intake: 80-125 g  Medications: see list Supplementation: Taking regularly  Using straws: No Drinking while eating: No Hair loss: Yes - taking biotin Carbonated beverages: No N/V/D/C:  Constipation  Last Lap-Band fill: 04/23/13; Feels like she may need a fill  Recent physical activity:   walking 2-3  days/week for 20 minutes  Progress Towards Goal(s):  In progress.  Handouts given during visit include:  Phase III B Protein and Non Starchy Vegetables   Nutritional Diagnosis:  [place]-3.3 Overweight/obesity related to past poor dietary habits and physical inactivity as evidenced by patient w/ recent LAGB surgery following dietary guidelines for continued weight loss.  Intervention:  Nutrition  education/diet advancement. Recommended adding more non-starchy vegetables to meals if she is still hungry and working on eating foods slowly and chewing thoroughly.   Monitoring/Evaluation:  Dietary intake, exercise, lap band fills, and body weight. Follow up in 3 months for 12 month post-op visit.

## 2013-11-25 ENCOUNTER — Encounter (INDEPENDENT_AMBULATORY_CARE_PROVIDER_SITE_OTHER): Payer: BC Managed Care – PPO | Admitting: Surgery

## 2013-12-23 ENCOUNTER — Encounter (INDEPENDENT_AMBULATORY_CARE_PROVIDER_SITE_OTHER): Payer: BC Managed Care – PPO | Admitting: Surgery

## 2013-12-30 ENCOUNTER — Encounter (INDEPENDENT_AMBULATORY_CARE_PROVIDER_SITE_OTHER): Payer: Self-pay | Admitting: Physician Assistant

## 2013-12-30 ENCOUNTER — Ambulatory Visit (INDEPENDENT_AMBULATORY_CARE_PROVIDER_SITE_OTHER): Payer: BC Managed Care – PPO | Admitting: Physician Assistant

## 2013-12-30 VITALS — BP 140/86 | HR 64 | Temp 97.1°F | Resp 18 | Ht 69.0 in | Wt 245.1 lb

## 2013-12-30 DIAGNOSIS — Z4651 Encounter for fitting and adjustment of gastric lap band: Secondary | ICD-10-CM

## 2013-12-30 NOTE — Progress Notes (Signed)
  HISTORY: [name] is a 38 y.o.female who received an AP-Standard lap-band in April 2014 by Dr. Lucia Gaskins. She comes in with 6 lbs weight loss since her last appointment in October. No fill was performed then. She has no complaints of regurgitation or reflux. She does have increased hunger and portion sizes and would like a fill today. She's re-engaging in a physical activity program as she was previously associated with the BELT program.  VITAL SIGNS: Filed Vitals:   12/30/13 1419  BP: 140/86  Pulse: 64  Temp: 97.1 F (36.2 C)  Resp: 18    PHYSICAL EXAM: Physical exam reveals a very well-appearing 38 y.o.female in no apparent distress Neurologic: Awake, alert, oriented Psych: Bright affect, conversant Respiratory: Breathing even and unlabored. No stridor or wheezing Abdomen: Soft, nontender, nondistended to palpation. Incisions well-healed. No incisional hernias. Port easily palpated. Extremities: Atraumatic, good range of motion.  ASSESMENT: 38 y.o.  female  s/p AP-Standard lap-band.   PLAN: The patient's port was accessed with a 20G Huber needle without difficulty. Clear fluid was aspirated and 0.5 mL saline was added to the port to give a total predicted volume of 4.5 mL. The patient was able to swallow water without difficulty following the procedure and was instructed to take clear liquids for the next 24-48 hours and advance slowly as tolerated. We'll have her back in one month for her one year anniversary appointment.

## 2013-12-30 NOTE — Patient Instructions (Signed)

## 2014-01-27 ENCOUNTER — Encounter (INDEPENDENT_AMBULATORY_CARE_PROVIDER_SITE_OTHER): Payer: BC Managed Care – PPO

## 2014-02-03 ENCOUNTER — Encounter (INDEPENDENT_AMBULATORY_CARE_PROVIDER_SITE_OTHER): Payer: Self-pay | Admitting: Physician Assistant

## 2014-02-03 ENCOUNTER — Ambulatory Visit (INDEPENDENT_AMBULATORY_CARE_PROVIDER_SITE_OTHER): Payer: BC Managed Care – PPO | Admitting: Physician Assistant

## 2014-02-03 VITALS — BP 142/86 | HR 71 | Temp 98.1°F | Resp 16 | Ht 69.0 in | Wt 238.8 lb

## 2014-02-03 DIAGNOSIS — Z4651 Encounter for fitting and adjustment of gastric lap band: Secondary | ICD-10-CM

## 2014-02-03 NOTE — Progress Notes (Signed)
  HISTORY: [name] is a 38 y.o.female who received an AP-Standard lap-band in April 2014 by Dr. Lucia Gaskins. She comes in with 6.4 lbs additional weight loss since her last visit a month ago. She has lost almost 50 lbs since surgery. She has been taking phentermine to help curb her appetite, but she has noticed her blood pressure begin to creep up as a result. Her pressure today in the office is consistent with this. She denies regurgitation or reflux symptoms. She does notice that her hunger and portion sizes are larger when she takes days off of the medication, namely on weekends.  VITAL SIGNS: Filed Vitals:   02/03/14 1337  BP: 142/86  Pulse: 71  Temp: 98.1 F (36.7 C)  Resp: 16    PHYSICAL EXAM: Physical exam reveals a very well-appearing 38 y.o.female in no apparent distress Neurologic: Awake, alert, oriented Psych: Bright affect, conversant Respiratory: Breathing even and unlabored. No stridor or wheezing Abdomen: Soft, nontender, nondistended to palpation. Incisions well-healed. No incisional hernias. Port easily palpated. Extremities: Atraumatic, good range of motion.  ASSESMENT: 38 y.o.  female  s/p AP-Standard lap-band.   PLAN: The patient's port was accessed with a 20G Huber needle without difficulty. Clear fluid was aspirated and 0.5 mL saline was added to the port to give a total predicted volume of 5 mL. The patient was able to swallow water without difficulty following the procedure and was instructed to take clear liquids for the next 24-48 hours and advance slowly as tolerated. I've asked her to reduce her phentermine use in the hopes that her BP will normalize as a result. We'll re-evaluate next month.

## 2014-02-03 NOTE — Patient Instructions (Signed)

## 2014-02-21 ENCOUNTER — Ambulatory Visit: Payer: BC Managed Care – PPO | Admitting: Dietician

## 2014-03-10 ENCOUNTER — Encounter (INDEPENDENT_AMBULATORY_CARE_PROVIDER_SITE_OTHER): Payer: Self-pay

## 2014-03-10 ENCOUNTER — Ambulatory Visit (INDEPENDENT_AMBULATORY_CARE_PROVIDER_SITE_OTHER): Payer: BC Managed Care – PPO | Admitting: Physician Assistant

## 2014-03-10 VITALS — BP 126/74 | HR 85 | Temp 97.8°F | Ht 68.0 in | Wt 232.0 lb

## 2014-03-10 DIAGNOSIS — Z9884 Bariatric surgery status: Secondary | ICD-10-CM

## 2014-03-10 NOTE — Patient Instructions (Signed)
Return in two months. Focus on good food choices as well as physical activity. Return sooner if you have an increase in hunger, portion sizes or weight. Return also for difficulty swallowing, night cough, reflux.   

## 2014-03-10 NOTE — Progress Notes (Signed)
  HISTORY: [name] is a 38 y.o.female who received an AP-Standard lap-band in April 2014 by Dr. Lucia Gaskins. She comes in with close to 7 lbs weight loss since her last visit one month ago when we did a fill. She has no new complaints. She's noticed her portion sizes have gotten smaller than before. Her hunger overall is under good control but she notices that she will occasionally have 'first bite syndrome' if she isn't careful. She's pleased with her weight loss.  VITAL SIGNS: Filed Vitals:   03/10/14 0911  BP: 126/74  Pulse: 85  Temp: 97.8 F (36.6 C)    PHYSICAL EXAM: Physical exam reveals a very well-appearing 38 y.o.female in no apparent distress Neurologic: Awake, alert, oriented Psych: Bright affect, conversant Respiratory: Breathing even and unlabored. No stridor or wheezing Extremities: Atraumatic, good range of motion. Skin: Warm, Dry, no rashes Musculoskeletal: Normal gait, Joints normal  ASSESMENT: 38 y.o.  female  s/p AP-Standard lap-band.   PLAN: As she is in the green zone, we deferred a fill today. We'll have her back in two months. She would like to see Clarise Cruz in Nutrition, and we'd be glad to refer her should she need it. If she notices increasing hunger or portions, she will call us for an appointment.

## 2014-05-12 ENCOUNTER — Ambulatory Visit (INDEPENDENT_AMBULATORY_CARE_PROVIDER_SITE_OTHER): Payer: BC Managed Care – PPO | Admitting: Physician Assistant

## 2014-05-12 ENCOUNTER — Encounter (INDEPENDENT_AMBULATORY_CARE_PROVIDER_SITE_OTHER): Payer: Self-pay

## 2014-05-12 VITALS — BP 138/76 | HR 72 | Temp 98.9°F | Resp 14 | Ht 69.0 in | Wt 226.8 lb

## 2014-05-12 DIAGNOSIS — Z9884 Bariatric surgery status: Secondary | ICD-10-CM

## 2014-05-12 NOTE — Patient Instructions (Signed)
Return in three months. Focus on good food choices as well as physical activity. Return sooner if you have an increase in hunger, portion sizes or weight. Return also for difficulty swallowing, night cough, reflux.   

## 2014-05-12 NOTE — Progress Notes (Signed)
  HISTORY: [name] is a 38 y.o.female who received an AP-Standard lap-band in April 2014 by Dr. Lucia Gaskins. The patient has lost 5 lbs since their last visit in May, and has lost 62 lbs since surgery. She reports good control of hunger and her portion sizes are remaining small. Her only issue she reports is occasionally having 'first bite syndrome,' so she's learning to concentrate on smaller bites and chewing adequately. She has no obstructive symptoms otherwise. She isn't exercising regularly but she tries to get in physical activity on occasion. Carbs are being kept at [facility].  VITAL SIGNS: Filed Vitals:   05/12/14 0902  BP: 138/76  Pulse: 72  Temp: 98.9 F (37.2 C)  Resp: 14    PHYSICAL EXAM: Physical exam reveals a very well-appearing 38 y.o.female in no apparent distress Neurologic: Awake, alert, oriented Psych: Bright affect, conversant Respiratory: Breathing even and unlabored. No stridor or wheezing Extremities: Atraumatic, good range of motion. Skin: Warm, Dry, no rashes Musculoskeletal: Normal gait, Joints normal  ASSESMENT: 38 y.o.  female  s/p AP-Standard lap-band.   PLAN: She does not want a fill today as she is continuing to lose weight. She appears to be in the green zone. I asked her to return in three months, unless she begins having increasing appetite or hunger in which case I'd like her to return sooner. She voiced understanding and agreement.

## 2014-08-11 ENCOUNTER — Encounter (INDEPENDENT_AMBULATORY_CARE_PROVIDER_SITE_OTHER): Payer: BC Managed Care – PPO

## 2014-08-12 ENCOUNTER — Other Ambulatory Visit: Payer: Self-pay

## 2014-10-19 ENCOUNTER — Other Ambulatory Visit: Payer: Self-pay | Admitting: Physician Assistant

## 2016-02-26 DIAGNOSIS — R413 Other amnesia: Secondary | ICD-10-CM | POA: Diagnosis not present

## 2016-03-28 DIAGNOSIS — Z4651 Encounter for fitting and adjustment of gastric lap band: Secondary | ICD-10-CM | POA: Diagnosis not present

## 2016-04-24 DIAGNOSIS — Z4651 Encounter for fitting and adjustment of gastric lap band: Secondary | ICD-10-CM | POA: Diagnosis not present

## 2016-05-09 DIAGNOSIS — Z4651 Encounter for fitting and adjustment of gastric lap band: Secondary | ICD-10-CM | POA: Diagnosis not present

## 2016-05-17 DIAGNOSIS — L669 Cicatricial alopecia, unspecified: Secondary | ICD-10-CM | POA: Diagnosis not present

## 2016-05-17 DIAGNOSIS — L739 Follicular disorder, unspecified: Secondary | ICD-10-CM | POA: Diagnosis not present

## 2016-07-19 DIAGNOSIS — Z23 Encounter for immunization: Secondary | ICD-10-CM | POA: Diagnosis not present

## 2016-11-06 DIAGNOSIS — N760 Acute vaginitis: Secondary | ICD-10-CM | POA: Diagnosis not present

## 2016-12-02 DIAGNOSIS — Z01419 Encounter for gynecological examination (general) (routine) without abnormal findings: Secondary | ICD-10-CM | POA: Diagnosis not present

## 2016-12-02 DIAGNOSIS — Z6832 Body mass index (BMI) 32.0-32.9, adult: Secondary | ICD-10-CM | POA: Diagnosis not present

## 2016-12-02 DIAGNOSIS — R87615 Unsatisfactory cytologic smear of cervix: Secondary | ICD-10-CM | POA: Diagnosis not present

## 2016-12-02 DIAGNOSIS — Z1231 Encounter for screening mammogram for malignant neoplasm of breast: Secondary | ICD-10-CM | POA: Diagnosis not present

## 2017-05-09 DIAGNOSIS — E669 Obesity, unspecified: Secondary | ICD-10-CM | POA: Diagnosis not present

## 2017-05-09 DIAGNOSIS — Z9884 Bariatric surgery status: Secondary | ICD-10-CM | POA: Diagnosis not present

## 2017-05-15 DIAGNOSIS — Z23 Encounter for immunization: Secondary | ICD-10-CM | POA: Diagnosis not present

## 2017-05-15 DIAGNOSIS — Z13220 Encounter for screening for lipoid disorders: Secondary | ICD-10-CM | POA: Diagnosis not present

## 2017-05-15 DIAGNOSIS — Z0000 Encounter for general adult medical examination without abnormal findings: Secondary | ICD-10-CM | POA: Diagnosis not present

## 2017-05-15 DIAGNOSIS — L669 Cicatricial alopecia, unspecified: Secondary | ICD-10-CM | POA: Diagnosis not present

## 2017-05-20 DIAGNOSIS — L661 Lichen planopilaris: Secondary | ICD-10-CM | POA: Diagnosis not present

## 2017-05-20 DIAGNOSIS — Z79899 Other long term (current) drug therapy: Secondary | ICD-10-CM | POA: Diagnosis not present

## 2017-05-27 DIAGNOSIS — M545 Low back pain: Secondary | ICD-10-CM | POA: Diagnosis not present

## 2017-12-04 DIAGNOSIS — Z01419 Encounter for gynecological examination (general) (routine) without abnormal findings: Secondary | ICD-10-CM | POA: Diagnosis not present

## 2017-12-04 DIAGNOSIS — Z1231 Encounter for screening mammogram for malignant neoplasm of breast: Secondary | ICD-10-CM | POA: Diagnosis not present

## 2017-12-04 DIAGNOSIS — Z0000 Encounter for general adult medical examination without abnormal findings: Secondary | ICD-10-CM | POA: Diagnosis not present

## 2017-12-04 DIAGNOSIS — Z130 Encounter for screening for diseases of the blood and blood-forming organs and certain disorders involving the immune mechanism: Secondary | ICD-10-CM | POA: Diagnosis not present

## 2017-12-04 DIAGNOSIS — Z1159 Encounter for screening for other viral diseases: Secondary | ICD-10-CM | POA: Diagnosis not present

## 2017-12-04 DIAGNOSIS — Z1329 Encounter for screening for other suspected endocrine disorder: Secondary | ICD-10-CM | POA: Diagnosis not present

## 2017-12-04 DIAGNOSIS — Z6833 Body mass index (BMI) 33.0-33.9, adult: Secondary | ICD-10-CM | POA: Diagnosis not present

## 2017-12-04 DIAGNOSIS — Z13220 Encounter for screening for lipoid disorders: Secondary | ICD-10-CM | POA: Diagnosis not present

## 2018-05-14 DIAGNOSIS — Z9884 Bariatric surgery status: Secondary | ICD-10-CM | POA: Diagnosis not present

## 2018-05-18 ENCOUNTER — Other Ambulatory Visit: Payer: Self-pay | Admitting: Surgery

## 2018-05-18 DIAGNOSIS — Z4651 Encounter for fitting and adjustment of gastric lap band: Secondary | ICD-10-CM

## 2018-05-19 ENCOUNTER — Ambulatory Visit
Admission: RE | Admit: 2018-05-19 | Discharge: 2018-05-19 | Disposition: A | Discharge status: Home / Self Care | Payer: Self-pay | Source: Ambulatory Visit | Attending: Surgery | Admitting: Surgery

## 2018-05-19 ENCOUNTER — Other Ambulatory Visit: Payer: Self-pay | Admitting: Surgery

## 2018-05-19 DIAGNOSIS — Z4651 Encounter for fitting and adjustment of gastric lap band: Secondary | ICD-10-CM

## 2018-05-19 DIAGNOSIS — K224 Dyskinesia of esophagus: Secondary | ICD-10-CM | POA: Diagnosis not present

## 2018-05-28 DIAGNOSIS — Z0000 Encounter for general adult medical examination without abnormal findings: Secondary | ICD-10-CM | POA: Diagnosis not present

## 2018-05-28 DIAGNOSIS — L669 Cicatricial alopecia, unspecified: Secondary | ICD-10-CM | POA: Diagnosis not present

## 2018-05-28 DIAGNOSIS — R4182 Altered mental status, unspecified: Secondary | ICD-10-CM | POA: Diagnosis not present

## 2018-05-28 DIAGNOSIS — Z13220 Encounter for screening for lipoid disorders: Secondary | ICD-10-CM | POA: Diagnosis not present

## 2018-05-28 DIAGNOSIS — R413 Other amnesia: Secondary | ICD-10-CM | POA: Diagnosis not present

## 2018-06-23 DIAGNOSIS — M79605 Pain in left leg: Secondary | ICD-10-CM | POA: Diagnosis not present

## 2018-06-23 DIAGNOSIS — M79604 Pain in right leg: Secondary | ICD-10-CM | POA: Diagnosis not present

## 2018-06-23 DIAGNOSIS — R600 Localized edema: Secondary | ICD-10-CM | POA: Diagnosis not present

## 2018-07-20 DIAGNOSIS — M79604 Pain in right leg: Secondary | ICD-10-CM | POA: Diagnosis not present

## 2018-07-20 DIAGNOSIS — I8311 Varicose veins of right lower extremity with inflammation: Secondary | ICD-10-CM | POA: Diagnosis not present

## 2018-07-20 DIAGNOSIS — I8312 Varicose veins of left lower extremity with inflammation: Secondary | ICD-10-CM | POA: Diagnosis not present

## 2018-07-20 DIAGNOSIS — M79605 Pain in left leg: Secondary | ICD-10-CM | POA: Diagnosis not present

## 2018-07-27 ENCOUNTER — Encounter: Payer: Self-pay | Admitting: Neurology

## 2018-07-27 ENCOUNTER — Ambulatory Visit: Payer: BLUE CROSS/BLUE SHIELD | Admitting: Neurology

## 2018-07-27 VITALS — BP 140/92 | HR 72 | Ht 69.25 in | Wt 243.5 lb

## 2018-07-27 DIAGNOSIS — R413 Other amnesia: Secondary | ICD-10-CM | POA: Diagnosis not present

## 2018-07-27 NOTE — Progress Notes (Signed)
PATIENT: [name] DOB: 1976/05/24  Chief Complaint  Patient presents with  . Memory Deficit    MMSE 30/30 - 9 animals.  States she has noticed her memory declining over the last couple of years.  She has difficulty with both short and long term memory.  Marland Kitchen PCP    Gaynelle Arabian, MD     HISTORICAL  [name] is a 42 years old female, seen in request by her primary care physician Dr. Marisue Humble, [place] for evaluation of memory deficit, initial evaluation was on July 27, 2018.  I have reviewed and summarized the referring note from the referring physician.  She had a past medical history of alopecia, bilateral lower extremity edema, HSV type II  She works as a Mudlogger for [employer], has stopped work under her, she has worked the same job for 15 years, she in charge of scheduling their home visit, since 2017, she had steady decline of her memory loss, now seems to plateaued, she used to be able to remember all of her client's name, now she has difficulty came up with their name, has to take frequent note, she has 44 year old at home,  She reported irregular sleep pattern, go to bed from 9 to 1 AM, got up from 1 to 4 AM, watch TV, reading Facebook, took another nap from 4-5 30 a.m., she often feels fatigued, tired during the day,  Her father was recently diagnosed with Alzheimer's, has aggressive behavior,  Laboratory evaluations in August 2019, normal CMP, B12, TSH, CBC   REVIEW OF SYSTEMS: Full 14 system review of systems performed and notable only for memory loss, insomnia, weight gain, fatigue, swelling legs, birthmark, joint pain, swelling, aching muscles, allergy, runny nose, change in appetite All other review of systems were negative.  ALLERGIES: Allergies  Allergen Reactions  . Tobramycin Other (See Comments)    Swelling and redness    HOME MEDICATIONS: Current Outpatient Medications  Medication Sig Dispense Refill  . hydroxychloroquine  (PLAQUENIL) 200 MG tablet Take 200 mg by mouth daily.    Marland Kitchen loratadine (CLARITIN) 10 MG tablet Take 10 mg by mouth daily.    . Multiple Vitamins-Minerals (ONE DAILY WOMENS) TABS Take 1 tablet by mouth daily.    . norethindrone-ethinyl estradiol (JUNEL FE,GILDESS FE,LOESTRIN FE) 1-20 MG-MCG tablet Take 1 tablet by mouth daily.    . Triamcinolone Acetonide 0.025 % LOTN as needed.      No current facility-administered medications for this visit.     PAST MEDICAL HISTORY: Past Medical History:  Diagnosis Date  . Anemia   . Dysrhythmia    HISTORY OF AFIB X 2 MONTHS 2004 - RESOLVED 1 MONTH POST BRONCHIOGENIC CYST REMOVAL  . Environmental allergies   . Heart murmur   . History of atrial fibrillation   . History of transfusion   . Memory deficit   . Migraine   . Morbid obesity ([place])     PAST SURGICAL HISTORY: Past Surgical History:  Procedure Laterality Date  . BREATH TEK H PYLORI N/A 12/23/2012   Procedure: BREATH TEK H PYLORI;  Surgeon: Shann Medal, MD;  Location: [facility];  Service: General;  Laterality: N/A;  . BREATH TEK H PYLORI N/A 01/27/2013   Procedure: Lauris Chroman;  Surgeon: Shann Medal, MD;  Location: [facility];  Service: General;  Laterality: N/A;  . CESAREAN SECTION  12/2005   Twins  . 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 DILATION AND CURETTAGE  OF UTERUS  2008  . LAPAROSCOPIC GASTRIC BANDING N/A 02/23/2013   Procedure: LAPAROSCOPIC GASTRIC BANDING;  Surgeon: Shann Medal, MD;  Location: [facility];  Service: General;  Laterality: N/A;  Lap Band    FAMILY HISTORY: Family History  Problem Relation Age of Onset  . Heart disease Mother   . Diabetes Mother   . Heart disease Father   . Alzheimer's disease Father   . Cancer Brother        leukemia  . Diabetes Brother   . Cancer Maternal Uncle        lung  . Cancer Paternal Aunt        breast    SOCIAL HISTORY: Social History   Socioeconomic History  . Marital status: Legally Separated     Spouse name: Not on file  . Number of children: 2  . Years of education: Masters  . Highest education level: Not on file  Occupational History  . Not on file  Social Needs  . Financial resource strain: Not on file  . Food insecurity:    Worry: Not on file    Inability: Not on file  . Transportation needs:    Medical: Not on file    Non-medical: Not on file  Tobacco Use  . Smoking status: Never Smoker  . Smokeless tobacco: Never Used  Substance and Sexual Activity  . Alcohol use: Yes    Comment: socially  . Drug use: No  . Sexual activity: Not on file  Lifestyle  . Physical activity:    Days per week: Not on file    Minutes per session: Not on file  . Stress: Not on file  Relationships  . Social connections:    Talks on phone: Not on file    Gets together: Not on file    Attends religious service: Not on file    Active member of club or organization: Not on file    Attends meetings of clubs or organizations: Not on file    Relationship status: Not on file  . Intimate partner violence:    Fear of current or ex partner: Not on file    Emotionally abused: Not on file    Physically abused: Not on file    Forced sexual activity: Not on file  Other Topics Concern  . Not on file  Social History Narrative   Lives at home with two children.   Right-handed.   One espresso shot daily.     PHYSICAL EXAM   Vitals:   07/27/18 1437  BP: (!) 140/92  Pulse: 72  Weight: 243 lb 8 oz (110.5 kg)  Height: 5' 9.25" (1.759 m)    Not recorded      Body mass index is 35.7 kg/m.  PHYSICAL EXAMNIATION:  Gen: NAD, conversant, well nourised, obese, well groomed                     Cardiovascular: Regular rate rhythm, no peripheral edema, warm, nontender. Eyes: Conjunctivae clear without exudates or hemorrhage Neck: Supple, no carotid bruits. Pulmonary: Clear to auscultation bilaterally   NEUROLOGICAL EXAM:  MMSE - Mini Mental State Exam 07/27/2018  Orientation to time 5    Orientation to Place 5  Registration 3  Attention/ Calculation 5  Recall 3  Language- name 2 objects 2  Language- repeat 1  Language- follow 3 step command 3  Language- read & follow direction 1  Write a sentence 1  Copy design 1  Total score 30  Animal naming was 9   CRANIAL NERVES: CN II: Visual fields are full to confrontation. Fundoscopic exam is normal with sharp discs and no vascular changes. Pupils are round equal and briskly reactive to light. CN III, IV, VI: extraocular movement are normal. No ptosis. CN V: Facial sensation is intact to pinprick in all 3 divisions bilaterally. Corneal responses are intact.  CN VII: Face is symmetric with normal eye closure and smile. CN VIII: Hearing is normal to rubbing fingers CN IX, X: Palate elevates symmetrically. Phonation is normal. CN XI: Head turning and shoulder shrug are intact CN XII: Tongue is midline with normal movements and no atrophy.  MOTOR: There is no pronator drift of out-stretched arms. Muscle bulk and tone are normal. Muscle strength is normal.  REFLEXES: Reflexes are 2+ and symmetric at the biceps, triceps, knees, and ankles. Plantar responses are flexor.  SENSORY: Intact to light touch, pinprick, positional sensation and vibratory sensation are intact in fingers and toes.  COORDINATION: Rapid alternating movements and fine finger movements are intact. There is no dysmetria on finger-to-nose and heel-knee-shin.    GAIT/STANCE: Posture is normal. Gait is steady with normal steps, base, arm swing, and turning. Heel and toe walking are normal. Tandem gait is normal.  Romberg is absent.   DIAGNOSTIC DATA (LABS, IMAGING, TESTING) - I reviewed patient records, labs, notes, testing and imaging myself where available.   ASSESSMENT AND PLAN  [name] is a 42 y.o. female   Memory loss  MRI of brain to rule out structure lesions.  Lab evaluations showed no treatable etiology     Marcial Pacas, M.D.  Ph.D.  [facility] [address], [place], [place] [address] Ph: [phone] Fax: [phone]  CC:  Gaynelle Arabian, MD

## 2018-07-28 ENCOUNTER — Telehealth: Payer: Self-pay | Admitting: Neurology

## 2018-07-28 NOTE — Telephone Encounter (Signed)
spoke to patient she will give me a call back when she's ready to schedule due to the cost  [employer] Auth: [id number] (exp. 07/27/18 to 08/25/18)

## 2018-08-03 DIAGNOSIS — I890 Lymphedema, not elsewhere classified: Secondary | ICD-10-CM | POA: Diagnosis not present

## 2018-08-10 DIAGNOSIS — Z4651 Encounter for fitting and adjustment of gastric lap band: Secondary | ICD-10-CM | POA: Diagnosis not present

## 2018-08-14 DIAGNOSIS — Z23 Encounter for immunization: Secondary | ICD-10-CM | POA: Diagnosis not present

## 2018-08-17 DIAGNOSIS — Q820 Hereditary lymphedema: Secondary | ICD-10-CM | POA: Diagnosis not present

## 2018-08-17 NOTE — Telephone Encounter (Signed)
Patient called in today wanting to schedule her MRI. She is scheduled for 08/19/18 at [facility].

## 2018-08-19 ENCOUNTER — Ambulatory Visit: Payer: BLUE CROSS/BLUE SHIELD

## 2018-08-19 ENCOUNTER — Encounter: Payer: BLUE CROSS/BLUE SHIELD | Attending: Surgery | Admitting: Skilled Nursing Facility1

## 2018-08-19 ENCOUNTER — Encounter: Payer: Self-pay | Admitting: Skilled Nursing Facility1

## 2018-08-19 DIAGNOSIS — Z713 Dietary counseling and surveillance: Secondary | ICD-10-CM | POA: Diagnosis not present

## 2018-08-19 DIAGNOSIS — R413 Other amnesia: Secondary | ICD-10-CM

## 2018-08-19 DIAGNOSIS — Z9884 Bariatric surgery status: Secondary | ICD-10-CM | POA: Diagnosis not present

## 2018-08-19 DIAGNOSIS — Z6841 Body Mass Index (BMI) 40.0 and over, adult: Secondary | ICD-10-CM | POA: Insufficient documentation

## 2018-08-19 NOTE — Patient Instructions (Addendum)
-  Start taking the appropriate multivitamin and 2 calcium   Options to eat without fear!  Half an egg salad sandwich and bag of chips   Salmon baked with olive oil or teriyaki  Broccoli and cheddar soup  Wendys chili  Half chicken salad sandwich    -Try not to weigh yourself more than once a day   -Aim to eat every 3-5 hours

## 2018-08-19 NOTE — Progress Notes (Signed)
Follow-up visit:  Post-Operative Gastric Band Surgery Primary concerns today: Post-operative Bariatric Surgery Nutrition Management.  Pt states her phycologist Dr. Ardath Sax told her to take CBD oil for anxiety.   Pt states she has had most fluid taken out of band due to reflux and then added in .8 cc. Pt states she is no longer struggling with reflux. Pt states some items such as chicken, bread. Pt states she has gained 20 pounds while having the fluid out of her band. Pt states she has been having memory issues lately: neurologist thinking it is lack of sleep. Pt states she is realizing she has had sleep issues for the past 8 years. Pt states she has a bowel movement about every 3 days. Pt states she does have bleeding fibroids. Pt states she had the lap band 2014 with her highest being 293 pounds and her lowest being 204 pounds. Pt states she is pretty sure she is perimenopausal with mood swings. 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 Pt states she has found food to be comforting. Pt states she hates protein shakes. Pt admits to foods getting stuck because she is eating too fast. Pt states she is just too afraid to eat anything because food used to get stuck or come back up. Pt states she weighs herself 2 times a day.   TANITA  BODY COMP RESULTS  08/19/2018   BMI (kg/m^2) 36.7   Fat Mass (lbs) 104.6   Fat Free Mass (lbs) 144   Total Body Water (lbs) 105     24-hr recall: skipping breakfast or lunch 5 days a week B (AM): premier protein with coffee  Snk (AM):  L (PM): 2 bags of chips Snk (PM):  D (PM): chimichanga refried beans and chips with salsa Snk (PM):   Fluid intake: water and coffee: 50-64 ounces  Estimated total protein intake: 60  Medications: See List Supplementation: woman's one a day  Using straws: occasionally  Drinking while eating: no Having you been chewing well: no Chewing/swallowing difficulties: no Changes in vision: no Changes to mood/headaches: yes states it  might be menopause  Hair loss/Changes to skin/Changes to nails: due to alopeica  Any difficulty focusing or concentrating: lack of sleep Sweating: no Dizziness/Lightheaded: no Palpitations: no Carbonated beverages:  N/V/D/C/GAS: bowel movements every 3 days  Abdominal Pain: no Dumping syndrome: no Last Lap-Band fill: .8cc  Recent physical activity:  15 minutes 2-3 times a week  Progress Towards Goal(s):  In progress.  Handouts given during visit include:  Meal ideas    Intervention:  Nutrition counseling. Due to the bodies need for essential vitamins, minerals, and fats the pt was educated on the need to consume a certain amount of calories as well as certain nutrients daily. Pt was educated on the need for daily physical activity and to reach a goal of at least 150 minutes of moderate to vigorous physical activity as directed by their physician due to such benefits as increased musculature and improved lab values.   Goals: -Start taking the appropriate multivitamin and 2 calcium  Options to eat without fear!  Half an egg salad sandwich and bag of chips   Salmon baked with olive oil or teriyaki  Broccoli and cheddar soup  Wendys chili  Half chicken salad sandwich   -Try not to weigh yourself more than once a day  -Aim to eat every 3-5 hours   Teaching Method Utilized:  Visual Auditory Hands on  Barriers to learning/adherence to lifestyle change: emotional eating  Demonstrated degree of understanding via:  Teach Back   Monitoring/Evaluation:  Dietary intake, exercise, and body weight.

## 2018-09-17 DIAGNOSIS — Q820 Hereditary lymphedema: Secondary | ICD-10-CM | POA: Diagnosis not present

## 2018-09-30 ENCOUNTER — Ambulatory Visit: Payer: BLUE CROSS/BLUE SHIELD | Admitting: Skilled Nursing Facility1

## 2018-10-17 DIAGNOSIS — Q820 Hereditary lymphedema: Secondary | ICD-10-CM | POA: Diagnosis not present

## 2018-11-17 DIAGNOSIS — Q820 Hereditary lymphedema: Secondary | ICD-10-CM | POA: Diagnosis not present

## 2018-12-18 DIAGNOSIS — Q820 Hereditary lymphedema: Secondary | ICD-10-CM | POA: Diagnosis not present

## 2018-12-24 DIAGNOSIS — Z1231 Encounter for screening mammogram for malignant neoplasm of breast: Secondary | ICD-10-CM | POA: Diagnosis not present

## 2018-12-24 DIAGNOSIS — Z6836 Body mass index (BMI) 36.0-36.9, adult: Secondary | ICD-10-CM | POA: Diagnosis not present

## 2018-12-24 DIAGNOSIS — Z3689 Encounter for other specified antenatal screening: Secondary | ICD-10-CM | POA: Diagnosis not present

## 2018-12-24 DIAGNOSIS — Z01419 Encounter for gynecological examination (general) (routine) without abnormal findings: Secondary | ICD-10-CM | POA: Diagnosis not present

## 2019-01-16 DIAGNOSIS — Q820 Hereditary lymphedema: Secondary | ICD-10-CM | POA: Diagnosis not present

## 2019-02-08 DIAGNOSIS — Z79899 Other long term (current) drug therapy: Secondary | ICD-10-CM | POA: Diagnosis not present

## 2019-02-08 DIAGNOSIS — B351 Tinea unguium: Secondary | ICD-10-CM | POA: Diagnosis not present

## 2019-02-16 DIAGNOSIS — Q820 Hereditary lymphedema: Secondary | ICD-10-CM | POA: Diagnosis not present

## 2019-03-18 DIAGNOSIS — Q820 Hereditary lymphedema: Secondary | ICD-10-CM | POA: Diagnosis not present

## 2019-04-18 DIAGNOSIS — Q820 Hereditary lymphedema: Secondary | ICD-10-CM | POA: Diagnosis not present

## 2019-04-26 DIAGNOSIS — M7750 Other enthesopathy of unspecified foot: Secondary | ICD-10-CM | POA: Diagnosis not present

## 2019-04-27 DIAGNOSIS — Z20828 Contact with and (suspected) exposure to other viral communicable diseases: Secondary | ICD-10-CM | POA: Diagnosis not present

## 2019-06-09 ENCOUNTER — Other Ambulatory Visit: Payer: Self-pay

## 2019-06-09 DIAGNOSIS — Z20822 Contact with and (suspected) exposure to covid-19: Secondary | ICD-10-CM

## 2019-06-10 LAB — NOVEL CORONAVIRUS, NAA: SARS-CoV-2, NAA: NOT DETECTED

## 2019-06-29 DIAGNOSIS — L661 Lichen planopilaris: Secondary | ICD-10-CM | POA: Diagnosis not present

## 2019-07-10 DIAGNOSIS — Z23 Encounter for immunization: Secondary | ICD-10-CM | POA: Diagnosis not present

## 2019-08-09 DIAGNOSIS — Z5181 Encounter for therapeutic drug level monitoring: Secondary | ICD-10-CM | POA: Diagnosis not present

## 2019-11-04 ENCOUNTER — Ambulatory Visit: Payer: 59 | Attending: Internal Medicine

## 2019-11-04 DIAGNOSIS — Z20822 Contact with and (suspected) exposure to covid-19: Secondary | ICD-10-CM | POA: Insufficient documentation

## 2019-11-07 LAB — NOVEL CORONAVIRUS, NAA: SARS-CoV-2, NAA: NOT DETECTED

## 2019-11-23 ENCOUNTER — Ambulatory Visit: Payer: 59 | Attending: Internal Medicine

## 2019-11-23 DIAGNOSIS — Z20822 Contact with and (suspected) exposure to covid-19: Secondary | ICD-10-CM

## 2019-11-24 LAB — NOVEL CORONAVIRUS, NAA: SARS-CoV-2, NAA: NOT DETECTED

## 2020-03-17 ENCOUNTER — Other Ambulatory Visit: Payer: Self-pay

## 2020-03-17 ENCOUNTER — Ambulatory Visit (INDEPENDENT_AMBULATORY_CARE_PROVIDER_SITE_OTHER): Payer: 59 | Admitting: Physician Assistant

## 2020-03-17 ENCOUNTER — Encounter: Payer: Self-pay | Admitting: Physician Assistant

## 2020-03-17 DIAGNOSIS — L669 Cicatricial alopecia, unspecified: Secondary | ICD-10-CM | POA: Diagnosis not present

## 2020-03-17 DIAGNOSIS — L219 Seborrheic dermatitis, unspecified: Secondary | ICD-10-CM | POA: Diagnosis not present

## 2020-03-17 MED ORDER — KETOCONAZOLE 2 % EX CREA: 1.0000 "application " | TOPICAL_CREAM | Freq: Every day | CUTANEOUS | 0 refills | Status: AC

## 2020-03-17 MED ORDER — HYDROXYCHLOROQUINE SULFATE 200 MG PO TABS: 200.0000 mg | ORAL_TABLET | Freq: Two times a day (BID) | ORAL | 10 refills | Status: DC

## 2020-03-21 NOTE — Progress Notes (Deleted)
   Follow-Up Visit   Subjective  [name] is a 44 y.o. female who presents for the following: Seborrheic Dermatitis (behind ears preious treatment tac lotion .025).    The following portions of the chart were reviewed this encounter and updated as appropriate:     Objective  Well appearing patient in no apparent distress; mood and affect are within normal limits.  A focused examination was performed including scalp, face and neck. Relevant physical exam findings are noted in the Assessment and Plan.  Objective  Mid Frontal Scalp: Mostly burned out alopecia. Hair improved with Plaquenil  Objective  Left Preauricular Area, Right Ear: Erythematous plaques with greasy scale.   Assessment & Plan  Cicatricial alopecia Mid Frontal Scalp  CMP - Mid Frontal Scalp Continue to take plaquenil Seborrheic dermatitis (2) Right Ear; Left Preauricular Area

## 2020-03-28 ENCOUNTER — Encounter: Payer: Self-pay | Admitting: Physician Assistant

## 2020-06-15 ENCOUNTER — Encounter: Payer: Self-pay | Admitting: Physician Assistant

## 2020-06-15 NOTE — Addendum Note (Signed)
Addended by: Robyne Askew R on: 06/15/2020 04:21 PM   Modules accepted: Level of Service

## 2020-06-15 NOTE — Progress Notes (Signed)
   Follow-Up Visit   Subjective  [name] is a 44 y.o. female who presents for the following: Seborrheic Dermatitis (behind ears preious treatment tac lotion .025).   The following portions of the chart were reviewed this encounter and updated as appropriate:     Objective  Well appearing patient in no apparent distress; mood and affect are within normal limits.  A focused examination was performed including head, including the scalp, face, neck, nose, ears, eyelids, and lips. Relevant physical exam findings are noted in the Assessment and Plan.  Objective  Left Preauricular Area, Right Ear: Erythematous plaques with greasy scale.   Objective  Mid Frontal Scalp: Mostly burned out alopecia. Hair improved with Plaquenil   Assessment & Plan  Seborrheic dermatitis (2) Right Ear; Left Preauricular Area  Ketoconazole cream OTC hydrocortisone  Cicatricial alopecia Mid Frontal Scalp  Topical steroids prn,  plaquenil    I, Dennie Vecchio, PA-C, have reviewed all documentation's for this visit.  The documentation on 06/15/20 for the exam, diagnosis, procedures and orders are all accurate and complete.

## 2020-08-12 ENCOUNTER — Other Ambulatory Visit: Payer: Self-pay | Admitting: Physician Assistant

## 2020-09-27 ENCOUNTER — Ambulatory Visit (INDEPENDENT_AMBULATORY_CARE_PROVIDER_SITE_OTHER): Payer: 59 | Admitting: Family Medicine

## 2020-09-27 ENCOUNTER — Other Ambulatory Visit: Payer: Self-pay

## 2020-09-27 VITALS — BP 142/76 | Ht 69.0 in | Wt 260.0 lb

## 2020-09-27 DIAGNOSIS — G8929 Other chronic pain: Secondary | ICD-10-CM

## 2020-09-27 DIAGNOSIS — M25571 Pain in right ankle and joints of right foot: Secondary | ICD-10-CM | POA: Diagnosis not present

## 2020-09-27 DIAGNOSIS — M2141 Flat foot [pes planus] (acquired), right foot: Secondary | ICD-10-CM | POA: Diagnosis not present

## 2020-09-27 DIAGNOSIS — M2142 Flat foot [pes planus] (acquired), left foot: Secondary | ICD-10-CM

## 2020-09-27 DIAGNOSIS — M25572 Pain in left ankle and joints of left foot: Secondary | ICD-10-CM

## 2020-09-28 ENCOUNTER — Encounter: Payer: Self-pay | Admitting: Family Medicine

## 2020-09-28 NOTE — Progress Notes (Signed)
PCP: Gaynelle Arabian, MD  Subjective:   HPI: Patient is a 44 y.o. female here for custom orthotics.  Patient reports she's had longstanding history of bilateral medial ankle pain and recently knee pain and weakness. No acute injury or trauma. Pain when seated and going to get up. Has not tried orthotics in the past.   Occasionally takes tylenol as needed.  Past Medical History:  Diagnosis Date  . Anemia   . Dysrhythmia    HISTORY OF AFIB X 2 MONTHS 2004 - RESOLVED 1 MONTH POST BRONCHIOGENIC CYST REMOVAL  . Environmental allergies   . Heart murmur   . History of atrial fibrillation   . History of transfusion   . Memory deficit   . Migraine   . Morbid obesity ([place])     Current Outpatient Medications on File Prior to Visit  Medication Sig Dispense Refill  . AUROVELA FE 1/20 1-20 MG-MCG tablet Take by mouth.    . hydroxychloroquine (PLAQUENIL) 200 MG tablet TAKE 1 TABLET BY MOUTH TWICE DAILY 180 tablet 3  . loratadine (CLARITIN) 10 MG tablet Take 10 mg by mouth daily.    . Multiple Vitamins-Minerals (ONE DAILY WOMENS) TABS Take 1 tablet by mouth daily.    . TRI-SPRINTEC 0.18/0.215/0.25 MG-35 MCG tablet Take 1 tablet by mouth daily.    . Triamcinolone Acetonide 0.025 % LOTN as needed.      No current facility-administered medications on file prior to visit.    Past Surgical History:  Procedure Laterality Date  . BREATH TEK H PYLORI N/A 12/23/2012   Procedure: BREATH TEK H PYLORI;  Surgeon: Shann Medal, MD;  Location: [facility];  Service: General;  Laterality: N/A;  . BREATH TEK H PYLORI N/A 01/27/2013   Procedure: Lauris Chroman;  Surgeon: Shann Medal, MD;  Location: [facility];  Service: General;  Laterality: N/A;  . CESAREAN SECTION  12/2005   Twins  . 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 DILATION AND CURETTAGE OF UTERUS  2008  . LAPAROSCOPIC GASTRIC BANDING N/A 02/23/2013   Procedure: LAPAROSCOPIC GASTRIC BANDING;  Surgeon: Shann Medal, MD;   Location: [facility];  Service: General;  Laterality: N/A;  Lap Band    Allergies  Allergen Reactions  . Tobramycin Other (See Comments)    Swelling and redness    Social History   Socioeconomic History  . Marital status: Legally Separated    Spouse name: Not on file  . Number of children: 2  . Years of education: Masters  . Highest education level: Not on file  Occupational History  . Not on file  Tobacco Use  . Smoking status: Never Smoker  . Smokeless tobacco: Never Used  Vaping Use  . Vaping Use: Never used  Substance and Sexual Activity  . Alcohol use: Yes    Comment: socially  . Drug use: No  . Sexual activity: Not on file  Other Topics Concern  . Not on file  Social History Narrative   Lives at home with two children.   Right-handed.   One espresso shot daily.   Social Determinants of Health   Financial Resource Strain:   . Difficulty of Paying Living Expenses: Not on file  Food Insecurity:   . Worried About Charity fundraiser in the Last Year: Not on file  . Ran Out of Food in the Last Year: Not on file  Transportation Needs:   . Lack of Transportation (Medical): Not on file  .  Lack of Transportation (Non-Medical): Not on file  Physical Activity:   . Days of Exercise per Week: Not on file  . Minutes of Exercise per Session: Not on file  Stress:   . Feeling of Stress : Not on file  Social Connections:   . Frequency of Communication with Friends and Family: Not on file  . Frequency of Social Gatherings with Friends and Family: Not on file  . Attends Religious Services: Not on file  . Active Member of Clubs or Organizations: Not on file  . Attends Archivist Meetings: Not on file  . Marital Status: Not on file  Intimate Partner Violence:   . Fear of Current or Ex-Partner: Not on file  . Emotionally Abused: Not on file  . Physically Abused: Not on file  . Sexually Abused: Not on file    Family History  Problem Relation Age of Onset  .  Heart disease Mother   . Diabetes Mother   . Heart disease Father   . Alzheimer's disease Father   . Cancer Brother        leukemia  . Diabetes Brother   . Cancer Maternal Uncle        lung  . Cancer Paternal Aunt        breast    BP (!) 142/76   Ht 5\' 9"  (1.753 m)   Wt 260 lb (117.9 kg)   BMI 38.40 kg/m   No flowsheet data found.  No flowsheet data found.  Review of Systems: See HPI above.     Objective:  Physical Exam:  Gen: NAD, comfortable in exam room  Genu valgus. Leg lengths equal.  Bilateral feet/ankles: Significant pes planus left worse than right. No other gross deformity, swelling, ecchymoses FROM with normal strength No TTP currently negative ant drawer and negative talar tilt.   Negative syndesmotic compression. Thompsons test negative. NV intact distally.  Assessment & Plan:  1. Bilateral posterior tibialis tendon dysfunction - worse left compared to right.  Custom orthotics made as noted.  Discussed how to break these in, what should prompt her to call us.  Post tib strengthening.  Icing if needed, tylenol if needed.  Patient was fitted for a : standard, cushioned, semi-rigid orthotic. The orthotic was heated and afterward the patient stood on the orthotic blank positioned on the orthotic stand. The patient was positioned in subtalar neutral position and 10 degrees of ankle dorsiflexion in a weight bearing stance. After completion of molding, a stable base was applied to the orthotic blank. The blank was ground to a stable position for weight bearing. Size: 10 Base: blue med density eva Posting: none Additional orthotic padding: none

## 2021-01-23 ENCOUNTER — Ambulatory Visit (INDEPENDENT_AMBULATORY_CARE_PROVIDER_SITE_OTHER): Payer: 59 | Admitting: Psychology

## 2021-01-23 DIAGNOSIS — F4323 Adjustment disorder with mixed anxiety and depressed mood: Secondary | ICD-10-CM

## 2021-02-06 ENCOUNTER — Ambulatory Visit (INDEPENDENT_AMBULATORY_CARE_PROVIDER_SITE_OTHER): Payer: 59 | Admitting: Psychology

## 2021-02-06 DIAGNOSIS — F4323 Adjustment disorder with mixed anxiety and depressed mood: Secondary | ICD-10-CM | POA: Diagnosis not present

## 2021-02-15 ENCOUNTER — Ambulatory Visit (INDEPENDENT_AMBULATORY_CARE_PROVIDER_SITE_OTHER): Payer: 59 | Admitting: Psychology

## 2021-02-15 DIAGNOSIS — F4323 Adjustment disorder with mixed anxiety and depressed mood: Secondary | ICD-10-CM | POA: Diagnosis not present

## 2021-02-22 ENCOUNTER — Ambulatory Visit: Payer: 59 | Admitting: Psychology

## 2021-03-07 ENCOUNTER — Ambulatory Visit (INDEPENDENT_AMBULATORY_CARE_PROVIDER_SITE_OTHER): Payer: 59 | Admitting: Psychology

## 2021-03-07 DIAGNOSIS — F4323 Adjustment disorder with mixed anxiety and depressed mood: Secondary | ICD-10-CM | POA: Diagnosis not present

## 2021-06-04 ENCOUNTER — Other Ambulatory Visit: Payer: Self-pay | Admitting: Physician Assistant

## 2021-06-13 ENCOUNTER — Ambulatory Visit (INDEPENDENT_AMBULATORY_CARE_PROVIDER_SITE_OTHER): Payer: 59 | Admitting: Physician Assistant

## 2021-06-13 ENCOUNTER — Encounter: Payer: Self-pay | Admitting: Physician Assistant

## 2021-06-13 ENCOUNTER — Other Ambulatory Visit: Payer: Self-pay

## 2021-06-13 DIAGNOSIS — L669 Cicatricial alopecia, unspecified: Secondary | ICD-10-CM

## 2021-06-13 DIAGNOSIS — Z1283 Encounter for screening for malignant neoplasm of skin: Secondary | ICD-10-CM

## 2021-06-13 DIAGNOSIS — D2361 Other benign neoplasm of skin of right upper limb, including shoulder: Secondary | ICD-10-CM

## 2021-06-13 DIAGNOSIS — D239 Other benign neoplasm of skin, unspecified: Secondary | ICD-10-CM

## 2021-06-13 MED ORDER — CLOBETASOL PROPIONATE 0.05 % EX SOLN: 1.0000 "application " | Freq: Two times a day (BID) | CUTANEOUS | 11 refills | Status: AC

## 2021-06-13 MED ORDER — HYDROXYCHLOROQUINE SULFATE 200 MG PO TABS: 200.0000 mg | ORAL_TABLET | Freq: Two times a day (BID) | ORAL | 3 refills | Status: DC

## 2021-06-13 NOTE — Progress Notes (Signed)
   Follow-Up Visit   Subjective  [name] is a 45 y.o. female who presents for the following: Annual Exam (Patient need refill for hydroxychloroquine '200mg'$  bid. It works for her hair loss. She does not have any pain at the this time. Her hair loss is stable with  no current flare. She hasn't had any side effects or adverse events. She also has a bump on her right upper arm that is hard and it won't go away.   The following portions of the chart were reviewed this encounter and updated as appropriate:  Tobacco  Allergies  Meds  Problems  Med Hx  Surg Hx  Fam Hx      Objective  Well appearing patient in no apparent distress; mood and affect are within normal limits.  A full examination was performed including scalp, head, eyes, ears, nose, lips, neck, chest, axillae, abdomen, back, buttocks, bilateral upper extremities, bilateral lower extremities, hands, feet, fingers, toes, fingernails, and toenails. All findings within normal limits unless otherwise noted below.  Scalp Thinning with slight scalp bogginess. Stable  Right Upper Arm - Anterior Small white nodule surrounded by hyperpigmented tissue.   Assessment & Plan  Cicatricial alopecia Scalp  clobetasol (TEMOVATE) 0.05 % external solution - Scalp Apply 1 application topically 2 (two) times daily.  hydroxychloroquine (PLAQUENIL) 200 MG tablet - Scalp Take 1 tablet (200 mg total) by mouth 2 (two) times daily.  Dermatofibroma Right Upper Arm - Anterior  observe    I, Ferlando Lia, PA-C, have reviewed all documentation's for this visit.  The documentation on 06/13/21 for the exam, diagnosis, procedures and orders are all accurate and complete.

## 2021-07-06 ENCOUNTER — Other Ambulatory Visit: Payer: Self-pay | Admitting: General Surgery

## 2021-07-06 DIAGNOSIS — R12 Heartburn: Secondary | ICD-10-CM

## 2021-07-11 ENCOUNTER — Other Ambulatory Visit: Payer: Self-pay | Admitting: General Surgery

## 2021-07-11 ENCOUNTER — Ambulatory Visit
Admission: RE | Admit: 2021-07-11 | Discharge: 2021-07-11 | Disposition: A | Discharge status: Home / Self Care | Payer: 59 | Source: Ambulatory Visit | Attending: General Surgery | Admitting: General Surgery

## 2021-07-11 DIAGNOSIS — R12 Heartburn: Secondary | ICD-10-CM

## 2021-12-17 ENCOUNTER — Other Ambulatory Visit: Payer: Self-pay | Admitting: *Deleted

## 2021-12-17 DIAGNOSIS — L669 Cicatricial alopecia, unspecified: Secondary | ICD-10-CM

## 2021-12-17 MED ORDER — HYDROXYCHLOROQUINE SULFATE 200 MG PO TABS: 200.0000 mg | ORAL_TABLET | Freq: Two times a day (BID) | ORAL | 0 refills | Status: DC

## 2022-01-28 ENCOUNTER — Ambulatory Visit: Payer: Managed Care, Other (non HMO) | Admitting: Family Medicine

## 2022-01-28 ENCOUNTER — Encounter: Payer: Self-pay | Admitting: Family Medicine

## 2022-01-28 VITALS — BP 152/88 | Ht 69.0 in | Wt 242.0 lb

## 2022-01-28 DIAGNOSIS — M2142 Flat foot [pes planus] (acquired), left foot: Secondary | ICD-10-CM | POA: Diagnosis not present

## 2022-01-28 DIAGNOSIS — M2141 Flat foot [pes planus] (acquired), right foot: Secondary | ICD-10-CM | POA: Diagnosis not present

## 2022-01-28 DIAGNOSIS — M25571 Pain in right ankle and joints of right foot: Secondary | ICD-10-CM | POA: Diagnosis not present

## 2022-01-28 DIAGNOSIS — G8929 Other chronic pain: Secondary | ICD-10-CM | POA: Diagnosis not present

## 2022-01-28 DIAGNOSIS — M25572 Pain in left ankle and joints of left foot: Secondary | ICD-10-CM

## 2022-01-28 NOTE — Patient Instructions (Signed)
Your pain is from the posterior tibialis tendons on the inside of your ankle/foot. ?The orthotics are still in good shape but may have lost some of their arch. ?We added a small scaphoid pad to these - if you notice this feels much better after breaking them in for a few days, you probably do need new custom orthotics (though the pads in the old orthotics should help for 3+ months). ?Do home exercises most days of the week as directed - these are important. ?Icing if needed, tylenol and/or aleve if needed for pain. ?Follow up with me for custom orthotics if this does make a difference. ?

## 2022-01-28 NOTE — Progress Notes (Signed)
PCP: Gaynelle Arabian, MD ? ?Subjective:  ? ?HPI: ?Patient is a 46 y.o. female here for right foot/arch pain. ? ?09/27/20: ?Patient reports she's had longstanding history of bilateral medial ankle pain and recently knee pain and weakness. ?No acute injury or trauma. ?Pain when seated and going to get up. ?Has not tried orthotics in the past.   ?Occasionally takes tylenol as needed. ? ?01/28/22: ?Patient reports her right arch has started bothering her more the past few weeks. ?Left hurts some but not as much. ?Orthotics had been helping to this point. ?No new injuries or trauma. ? ?Past Medical History:  ?Diagnosis Date  ? Anemia   ? Dysrhythmia   ? HISTORY OF AFIB X 2 MONTHS 2004 - RESOLVED 1 MONTH POST BRONCHIOGENIC CYST REMOVAL  ? Environmental allergies   ? Heart murmur   ? History of atrial fibrillation   ? History of transfusion   ? Memory deficit   ? Migraine   ? Morbid obesity ([place])   ? ? ?Current Outpatient Medications on File Prior to Visit  ?Medication Sig Dispense Refill  ? AUROVELA FE 1/20 1-20 MG-MCG tablet Take by mouth.    ? clobetasol (TEMOVATE) 0.05 % external solution Apply 1 application topically 2 (two) times daily. 50 mL 11  ? hydroxychloroquine (PLAQUENIL) 200 MG tablet Take 1 tablet (200 mg total) by mouth 2 (two) times daily. 180 tablet 0  ? loratadine (CLARITIN) 10 MG tablet Take 10 mg by mouth daily.    ? Multiple Vitamins-Minerals (ONE DAILY WOMENS) TABS Take 1 tablet by mouth daily.    ? TRI-SPRINTEC 0.18/0.215/0.25 MG-35 MCG tablet Take 1 tablet by mouth daily.    ? Triamcinolone Acetonide 0.025 % LOTN as needed.     ? ?No current facility-administered medications on file prior to visit.  ? ? ?Past Surgical History:  ?Procedure Laterality Date  ? BREATH TEK H PYLORI N/A 12/23/2012  ? Procedure: BREATH TEK H PYLORI;  Surgeon: Shann Medal, MD;  Location: [facility];  Service: General;  Laterality: N/A;  ? [place] N/A 01/27/2013  ? Procedure: BREATH TEK H PYLORI;  Surgeon: Shann Medal, MD;  Location: [facility];  Service: General;  Laterality: N/A;  ? CESAREAN SECTION  12/2005  ? Twins  ? CYSTECTOMY  2004  ? Bronchialgenic cyst  ? DILATION AND CURETTAGE OF UTERUS  2008  ? LAPAROSCOPIC GASTRIC BANDING N/A 02/23/2013  ? Procedure: LAPAROSCOPIC GASTRIC BANDING;  Surgeon: Shann Medal, MD;  Location: [facility];  Service: General;  Laterality: N/A;  Lap Band  ? ? ?Allergies  ?Allergen Reactions  ? Tobramycin Other (See Comments)  ?  Swelling and redness  ? ? ?Social History  ? ?Socioeconomic History  ? Marital status: Divorced  ?  Spouse name: Not on file  ? Number of children: 2  ? Years of education: Masters  ? Highest education level: Not on file  ?Occupational History  ? Not on file  ?Tobacco Use  ? Smoking status: Never  ? Smokeless tobacco: Never  ?Vaping Use  ? Vaping Use: Never used  ?Substance and Sexual Activity  ? Alcohol use: Yes  ?  Comment: socially  ? Drug use: No  ? Sexual activity: Not on file  ?Other Topics Concern  ? Not on file  ?Social History Narrative  ? Lives at home with two children.  ? Right-handed.  ? One espresso shot daily.  ? ?Social Determinants of Health  ? ?Emergency planning/management officer  Strain: Not on file  ?Food Insecurity: Not on file  ?Transportation Needs: Not on file  ?Physical Activity: Not on file  ?Stress: Not on file  ?Social Connections: Not on file  ?Intimate Partner Violence: Not on file  ? ? ?Family History  ?Problem Relation Age of Onset  ? Heart disease Mother   ? Diabetes Mother   ? Heart disease Father   ? Alzheimer's disease Father   ? Cancer Brother   ?     leukemia  ? Diabetes Brother   ? Cancer Maternal Uncle   ?     lung  ? Cancer Paternal Aunt   ?     breast  ? ? ?BP (!) 152/88   Ht '5\' 9"'$  (1.753 m)   Wt 242 lb (109.8 kg)   BMI 35.74 kg/m?  ? ?   ? View : No data to display.  ?  ?  ?  ? ? ?   ? View : No data to display.  ?  ?  ?  ? ? ?Review of Systems: ?See HPI above. ?    ?Objective:  ?Physical Exam: ? ?Gen: NAD, comfortable in exam  room ? ?Bilateral feet/ankles: ?Significant pes planus bilaterally.  No other swelling, bruising, deformity. ?FROM with normal strength ?TTP right post tib tendon course.  No left foot/ankle tenderness ?Negative ant drawer and negative talar tilt.   ?Negative syndesmotic compression. ?Thompsons test negative. ?NV intact distally. ? ?Assessment & Plan:  ?1. Bilateral posterior tibialis tendon dysfunction - orthotics are in good shape but long arch appears to be more flattened compared to when these were constructed.  Small scaphoid pad added to each to see if this provides some relief.  If this does advised would consider new custom orthotics.  Reviewed home exercises for this as well. ?

## 2022-01-29 ENCOUNTER — Ambulatory Visit: Payer: Self-pay | Admitting: Family Medicine

## 2022-02-18 ENCOUNTER — Telehealth: Payer: Self-pay | Admitting: Physician Assistant

## 2022-02-18 NOTE — Telephone Encounter (Signed)
Phone call to patient to let her know that she can use adapalene gel and Hibiclens, She can contact our office if she has any questions or concerns.   ?

## 2022-02-18 NOTE — Telephone Encounter (Signed)
Patient calling to see if there is any product that can be prescribed for ingrown hairs in pubic area due to waxing of that area.  ?

## 2022-02-20 ENCOUNTER — Ambulatory Visit: Payer: Managed Care, Other (non HMO) | Admitting: Physician Assistant

## 2022-03-11 ENCOUNTER — Ambulatory Visit: Payer: Self-pay

## 2022-03-11 ENCOUNTER — Ambulatory Visit: Payer: Commercial Managed Care - HMO | Admitting: Family Medicine

## 2022-03-11 VITALS — BP 148/92 | Ht 69.0 in | Wt 251.0 lb

## 2022-03-11 DIAGNOSIS — G8929 Other chronic pain: Secondary | ICD-10-CM | POA: Diagnosis not present

## 2022-03-11 DIAGNOSIS — M25571 Pain in right ankle and joints of right foot: Secondary | ICD-10-CM | POA: Diagnosis not present

## 2022-03-12 ENCOUNTER — Encounter: Payer: Self-pay | Admitting: Family Medicine

## 2022-03-12 NOTE — Progress Notes (Signed)
PCP: Gaynelle Arabian, MD ? ?Subjective:  ? ?HPI: ?Patient is a 46 y.o. female here for right foot/arch pain. ? ?09/27/20: ?Patient reports she's had longstanding history of bilateral medial ankle pain and recently knee pain and weakness. ?No acute injury or trauma. ?Pain when seated and going to get up. ?Has not tried orthotics in the past.   ?Occasionally takes tylenol as needed. ? ?01/28/22: ?Patient reports her right arch has started bothering her more the past few weeks. ?Left hurts some but not as much. ?Orthotics had been helping to this point. ?No new injuries or trauma. ? ?5/15: ?Patient reports she recently fell going up the stairs and injured left foot primarily. ?Noted flare up of right medial ankle pain. ?Pain is achy, in same location as prior pain. ?Having to go down stairs sideways due to pain. ?Saw Dr. Marisue Humble - just started meloxicam and noticed this has helped quite a bit. ? ?Past Medical History:  ?Diagnosis Date  ? Anemia   ? Dysrhythmia   ? HISTORY OF AFIB X 2 MONTHS 2004 - RESOLVED 1 MONTH POST BRONCHIOGENIC CYST REMOVAL  ? Environmental allergies   ? Heart murmur   ? History of atrial fibrillation   ? History of transfusion   ? Memory deficit   ? Migraine   ? Morbid obesity ([place])   ? ? ?Current Outpatient Medications on File Prior to Visit  ?Medication Sig Dispense Refill  ? AUROVELA FE 1/20 1-20 MG-MCG tablet Take by mouth.    ? clobetasol (TEMOVATE) 0.05 % external solution Apply 1 application topically 2 (two) times daily. 50 mL 11  ? hydroxychloroquine (PLAQUENIL) 200 MG tablet Take 1 tablet (200 mg total) by mouth 2 (two) times daily. 180 tablet 0  ? loratadine (CLARITIN) 10 MG tablet Take 10 mg by mouth daily.    ? meloxicam (MOBIC) 15 MG tablet Take 15 mg by mouth daily.    ? Multiple Vitamins-Minerals (ONE DAILY WOMENS) TABS Take 1 tablet by mouth daily.    ? TRI-SPRINTEC 0.18/0.215/0.25 MG-35 MCG tablet Take 1 tablet by mouth daily.    ? Triamcinolone Acetonide 0.025 % LOTN as needed.      ? ?No current facility-administered medications on file prior to visit.  ? ? ?Past Surgical History:  ?Procedure Laterality Date  ? BREATH TEK H PYLORI N/A 12/23/2012  ? Procedure: BREATH TEK H PYLORI;  Surgeon: Shann Medal, MD;  Location: [facility];  Service: General;  Laterality: N/A;  ? [place] N/A 01/27/2013  ? Procedure: BREATH TEK H PYLORI;  Surgeon: Shann Medal, MD;  Location: [facility];  Service: General;  Laterality: N/A;  ? CESAREAN SECTION  12/2005  ? Twins  ? CYSTECTOMY  2004  ? Bronchialgenic cyst  ? DILATION AND CURETTAGE OF UTERUS  2008  ? LAPAROSCOPIC GASTRIC BANDING N/A 02/23/2013  ? Procedure: LAPAROSCOPIC GASTRIC BANDING;  Surgeon: Shann Medal, MD;  Location: [facility];  Service: General;  Laterality: N/A;  Lap Band  ? ? ?Allergies  ?Allergen Reactions  ? Tobramycin Other (See Comments)  ?  Swelling and redness  ? ? ?Social History  ? ?Socioeconomic History  ? Marital status: Divorced  ?  Spouse name: Not on file  ? Number of children: 2  ? Years of education: Masters  ? Highest education level: Not on file  ?Occupational History  ? Not on file  ?Tobacco Use  ? Smoking status: Never  ? Smokeless tobacco: Never  ?Vaping Use  ?  Vaping Use: Never used  ?Substance and Sexual Activity  ? Alcohol use: Yes  ?  Comment: socially  ? Drug use: No  ? Sexual activity: Not on file  ?Other Topics Concern  ? Not on file  ?Social History Narrative  ? Lives at home with two children.  ? Right-handed.  ? One espresso shot daily.  ? ?Social Determinants of Health  ? ?Financial Resource Strain: Not on file  ?Food Insecurity: Not on file  ?Transportation Needs: Not on file  ?Physical Activity: Not on file  ?Stress: Not on file  ?Social Connections: Not on file  ?Intimate Partner Violence: Not on file  ? ? ?Family History  ?Problem Relation Age of Onset  ? Heart disease Mother   ? Diabetes Mother   ? Heart disease Father   ? Alzheimer's disease Father   ? Cancer Brother   ?     leukemia  ? Diabetes  Brother   ? Cancer Maternal Uncle   ?     lung  ? Cancer Paternal Aunt   ?     breast  ? ? ?BP (!) 148/92   Ht '5\' 9"'$  (1.753 m)   Wt 251 lb (113.9 kg)   BMI 37.07 kg/m?  ? ?   ? View : No data to display.  ?  ?  ?  ? ? ?   ? View : No data to display.  ?  ?  ?  ? ? ?Review of Systems: ?See HPI above. ?    ?Objective:  ?Physical Exam: ? ?Gen: NAD, comfortable in exam room ? ?Right foot/ankle: ?Significant pes planus bilaterally.  Lymphedema but no other gross deformity, swelling, ecchymoses ?FROM with pain internal rotation. ?TTP right post tib tendon course.  No other tenderness. ?Negative ant drawer and negative talar tilt.   ?Thompsons test negative. ?NV intact distally. ? ?Limited MSK u/s right ankle:  No joint effusion.  Talar dome appears normal.  Post tib tendon notably thickened with mild tenosynovitis.  No tendon tears medial ankle.  Soft tissue swelling through lower leg. ? ?Assessment & Plan:  ?1. Bilateral posterior tibialis tendon dysfunction - Continue using orthotics, doing home exercises.  Meloxicam seems to be helping quite a bit too though she just started this.  Advised to let us know if this does not continue to be the case.  Limited ultrasound was reassuring. ?

## 2022-04-16 ENCOUNTER — Other Ambulatory Visit: Payer: Self-pay

## 2022-04-16 DIAGNOSIS — L669 Cicatricial alopecia, unspecified: Secondary | ICD-10-CM

## 2022-04-16 MED ORDER — HYDROXYCHLOROQUINE SULFATE 200 MG PO TABS
200.0000 mg | ORAL_TABLET | Freq: Two times a day (BID) | ORAL | 0 refills | Status: AC
Start: 2022-04-16 — End: ?

## 2022-07-29 ENCOUNTER — Ambulatory Visit (HOSPITAL_COMMUNITY)
Admission: EM | Admit: 2022-07-29 | Discharge: 2022-07-29 | Discharge status: Against Medical Advice | Payer: Commercial Managed Care - HMO

## 2022-07-29 NOTE — ED Notes (Signed)
Pt pulled into triage room, BP 135/80, headache is mild and patient reports wants to go home due to BP being lower per [place] PA. Provider made patient aware if symptoms get worse to seek treatment.   Pt left.

## 2022-08-06 ENCOUNTER — Ambulatory Visit: Payer: Commercial Managed Care - HMO | Admitting: Physician Assistant

## 2022-08-08 ENCOUNTER — Encounter (HOSPITAL_BASED_OUTPATIENT_CLINIC_OR_DEPARTMENT_OTHER): Payer: Self-pay | Admitting: Emergency Medicine

## 2022-08-08 ENCOUNTER — Telehealth: Payer: Self-pay | Admitting: Internal Medicine

## 2022-08-08 ENCOUNTER — Emergency Department (HOSPITAL_BASED_OUTPATIENT_CLINIC_OR_DEPARTMENT_OTHER): Payer: Commercial Managed Care - HMO | Admitting: Radiology

## 2022-08-08 ENCOUNTER — Other Ambulatory Visit (HOSPITAL_BASED_OUTPATIENT_CLINIC_OR_DEPARTMENT_OTHER): Payer: Self-pay

## 2022-08-08 ENCOUNTER — Other Ambulatory Visit: Payer: Self-pay

## 2022-08-08 ENCOUNTER — Emergency Department (HOSPITAL_BASED_OUTPATIENT_CLINIC_OR_DEPARTMENT_OTHER)
Admission: EM | Admit: 2022-08-08 | Discharge: 2022-08-08 | Disposition: A | Discharge status: Home / Self Care | Payer: Commercial Managed Care - HMO | Attending: Student | Admitting: Student

## 2022-08-08 ENCOUNTER — Emergency Department (HOSPITAL_BASED_OUTPATIENT_CLINIC_OR_DEPARTMENT_OTHER): Payer: Commercial Managed Care - HMO

## 2022-08-08 DIAGNOSIS — Z79899 Other long term (current) drug therapy: Secondary | ICD-10-CM | POA: Insufficient documentation

## 2022-08-08 DIAGNOSIS — R791 Abnormal coagulation profile: Secondary | ICD-10-CM | POA: Insufficient documentation

## 2022-08-08 DIAGNOSIS — J90 Pleural effusion, not elsewhere classified: Secondary | ICD-10-CM | POA: Insufficient documentation

## 2022-08-08 DIAGNOSIS — I10 Essential (primary) hypertension: Secondary | ICD-10-CM | POA: Insufficient documentation

## 2022-08-08 DIAGNOSIS — R079 Chest pain, unspecified: Secondary | ICD-10-CM | POA: Diagnosis present

## 2022-08-08 LAB — CBC WITH DIFFERENTIAL/PLATELET
Abs Immature Granulocytes: 0.02 10*3/uL (ref 0.00–0.07)
Basophils Absolute: 0 10*3/uL (ref 0.0–0.1)
Basophils Relative: 1 %
Eosinophils Absolute: 0.1 10*3/uL (ref 0.0–0.5)
Eosinophils Relative: 2 %
HCT: 39.9 % (ref 36.0–46.0)
Hemoglobin: 12.8 g/dL (ref 12.0–15.0)
Immature Granulocytes: 0 %
Lymphocytes Relative: 21 %
Lymphs Abs: 1.3 10*3/uL (ref 0.7–4.0)
MCH: 27.1 pg (ref 26.0–34.0)
MCHC: 32.1 g/dL (ref 30.0–36.0)
MCV: 84.4 fL (ref 80.0–100.0)
Monocytes Absolute: 0.5 10*3/uL (ref 0.1–1.0)
Monocytes Relative: 7 %
Neutro Abs: 4.3 10*3/uL (ref 1.7–7.7)
Neutrophils Relative %: 69 %
Platelets: 231 10*3/uL (ref 150–400)
RBC: 4.73 MIL/uL (ref 3.87–5.11)
RDW: 15.4 % (ref 11.5–15.5)
WBC: 6.3 10*3/uL (ref 4.0–10.5)
nRBC: 0 % (ref 0.0–0.2)

## 2022-08-08 LAB — COMPREHENSIVE METABOLIC PANEL
ALT: 18 U/L (ref 0–44)
AST: 18 U/L (ref 15–41)
Albumin: 4.4 g/dL (ref 3.5–5.0)
Alkaline Phosphatase: 51 U/L (ref 38–126)
Anion gap: 10 (ref 5–15)
BUN: 13 mg/dL (ref 6–20)
CO2: 26 mmol/L (ref 22–32)
Calcium: 9.5 mg/dL (ref 8.9–10.3)
Chloride: 104 mmol/L (ref 98–111)
Creatinine, Ser: 0.91 mg/dL (ref 0.44–1.00)
GFR, Estimated: >60 mL/min (ref >60)
Glucose, Bld: 88 mg/dL (ref 70–99)
Potassium: 4.2 mmol/L (ref 3.5–5.1)
Sodium: 140 mmol/L (ref 135–145)
Total Bilirubin: 0.9 mg/dL (ref 0.3–1.2)
Total Protein: 7.5 g/dL (ref 6.5–8.1)

## 2022-08-08 LAB — HCG, SERUM, QUALITATIVE: Preg, Serum: NEGATIVE

## 2022-08-08 LAB — D-DIMER, QUANTITATIVE: D-Dimer, Quant: 0.77 ug/mL-FEU — ABNORMAL HIGH (ref 0.00–0.50)

## 2022-08-08 LAB — TROPONIN I (HIGH SENSITIVITY): Troponin I (High Sensitivity): 2 ng/L (ref <18)

## 2022-08-08 MED ORDER — MAALOX MAX 400-400-40 MG/5ML PO SUSP
15.0000 mL | Freq: Four times a day (QID) | ORAL | 0 refills | Status: DC | PRN
  Filled 2022-08-08: qty 355, 6d supply, fill #0

## 2022-08-08 MED ORDER — KETOROLAC TROMETHAMINE 15 MG/ML IJ SOLN
15.0000 mg | Freq: Once | INTRAMUSCULAR | Status: AC
  Administered 2022-08-08: 15 mg via INTRAVENOUS
  Filled 2022-08-08: qty 1

## 2022-08-08 MED ORDER — ALUM & MAG HYDROXIDE-SIMETH 200-200-20 MG/5ML PO SUSP
30.0000 mL | Freq: Once | ORAL | Status: AC
  Administered 2022-08-08: 30 mL via ORAL
  Filled 2022-08-08: qty 30

## 2022-08-08 MED ORDER — LIDOCAINE VISCOUS HCL 2 % MT SOLN
15.0000 mL | Freq: Once | OROMUCOSAL | Status: AC
  Administered 2022-08-08: 15 mL via ORAL
  Filled 2022-08-08: qty 15

## 2022-08-08 MED ORDER — IOHEXOL 350 MG/ML SOLN
100.0000 mL | Freq: Once | INTRAVENOUS | Status: AC | PRN
  Administered 2022-08-08: 75 mL via INTRAVENOUS

## 2022-08-08 NOTE — Telephone Encounter (Signed)
     Front desk  CT with very small loculated right postero medial pleural effusion now but absent in prior imaging in 2009. Interim hx of lap band. Concern for aspiration. PE negative in ER 08/08/2022  Call from [place]  - give opd followup with a pleural doc - [place], Shelly Coss. [place]     CT Angio Chest PE W and/or Wo Contrast  Result Date: 08/08/2022 CLINICAL DATA:  History of AFib with left-sided chest pain EXAM: CT ANGIOGRAPHY CHEST WITH CONTRAST TECHNIQUE: Multidetector CT imaging of the chest was performed using the standard protocol during bolus administration of intravenous contrast. Multiplanar CT image reconstructions and MIPs were obtained to evaluate the vascular anatomy. RADIATION DOSE REDUCTION: This exam was performed according to the departmental dose-optimization program which includes automated exposure control, adjustment of the mA and/or kV according to patient size and/or use of iterative reconstruction technique. CONTRAST:  57m OMNIPAQUE IOHEXOL 350 MG/ML SOLN COMPARISON:  CT chest dated 09/28/2008, chest radiograph dated 08/08/2022 FINDINGS: Cardiovascular: The study is high quality for the evaluation of pulmonary embolism. There are no filling defects in the central, lobar, segmental or subsegmental pulmonary artery branches to suggest acute pulmonary embolism. Great vessels are normal in course and caliber. Normal heart size. Coronary artery calcifications. No significant pericardial fluid/thickening. 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 Mediastinum/Nodes: Partially imaged thyroid gland without clinically significant nodules. Small hiatal hernia. No pathologically enlarged axillary, supraclavicular, mediastinal, or hilar lymph nodes. Lungs/Pleura: The central airways are patent. No focal consolidation. No pneumothorax. Small loculated pleural effusion along the posteromedial right lower  lobe. Upper abdomen: Horizontal orientation of the gastric band is again seen with phi angle measuring approximately 62 degrees. Musculoskeletal: No acute or abnormal lytic or blastic osseous lesions. Review of the MIP images confirms the above findings. IMPRESSION: 1. No finding of pulmonary embolism. 2. Small loculated pleural effusion along the posteromedial right lower lobe. 3. Findings of gastric band slippage.  Small hiatal hernia. Electronically Signed   By: LDarrin NipperM.D.   On: 08/08/2022 10:55   DG Chest 2 View  Result Date: 08/08/2022 CLINICAL DATA:  Chest pain EXAM: CHEST - 2 VIEW COMPARISON:  09/28/2008 FINDINGS: Remote posterior right 6th rib defect could be postsurgical or less likely posttraumatic. Midline trachea. Normal heart size and mediastinal contours. No pleural effusion or pneumothorax. Numerous leads and wires project over the chest. Clear lungs. IMPRESSION: No active cardiopulmonary disease. Electronically Signed   By: KAbigail MiyamotoM.D.   On: 08/08/2022 08:22

## 2022-08-08 NOTE — ED Notes (Signed)
Discharge paperwork given and verbally understood. 

## 2022-08-08 NOTE — ED Notes (Signed)
Chest pain left side, started a few hours ago and doesn't radiate. No N/V.

## 2022-08-08 NOTE — ED Triage Notes (Signed)
Pt arrives to ED with c/o left sided chest pain that started at 3am this morning.

## 2022-08-08 NOTE — ED Provider Notes (Signed)
[facility] EMERGENCY DEPT Provider Note  CSN: [id number] Arrival date & time: 08/08/22 4008  Chief Complaint(s) Chest Pain  HPI [name] is a 46 y.o. female with PMH A-fib currently not on anticoagulation, migraines, gastric banding, anemia, previous history of bronchogenic cyst status post removal who presents emergency department for evaluation of chest pain and shortness of breath.  Patient states that she was recently placed on hormonal therapy by her OB/GYN but the patient had some hypertension based side effects and stopped this medication.  She was then placed on blood pressure medication which improved her blood pressure but last night started to have left-sided chest pain with radiation into the left arm and left back.  She states that she also has some very mild shortness of breath that she only feels when walking up hills or flights of stairs.  Currently rates her chest pain 3 out of 10 with no associated nausea, vomiting, diaphoresis or other systemic symptoms.   Past Medical History Past Medical History:  Diagnosis Date   Anemia    Dysrhythmia    HISTORY OF AFIB X 2 MONTHS 2004 - RESOLVED 1 MONTH POST BRONCHIOGENIC CYST REMOVAL   Environmental allergies    Heart murmur    History of atrial fibrillation    History of transfusion    Memory deficit    Migraine    Morbid obesity [facility])    Patient Active Problem List   Diagnosis Date Noted   Memory loss 07/27/2018   History of laparoscopic adjustable gastric banding, AP Standard, 02/23/2013 03/11/2013   Morbid obesity, weight - 288, BMI - 42.6 11/26/2012   Home Medication(s) Prior to Admission medications   Medication Sig Start Date End Date Taking? Authorizing Provider  alum & mag hydroxide-simeth (MAALOX MAX) 400-400-40 MG/5ML suspension Take 15 mLs by mouth every 6 (six) hours as needed for indigestion. 08/08/22  Yes Guiselle Mian, MD  amLODipine (NORVASC) 5 MG tablet Take 5 mg by mouth daily.  08/06/22   [provider]  AUROVELA FE 1/20 1-20 MG-MCG tablet Take by mouth. 09/22/20   [provider]  clobetasol (TEMOVATE) 0.05 % external solution Apply 1 application topically 2 (two) times daily. 06/13/21   Sheffield, Ronalee Red, PA-C  hydroxychloroquine (PLAQUENIL) 200 MG tablet Take 1 tablet (200 mg total) by mouth 2 (two) times daily. 04/16/22   Sheffield, Ronalee Red, PA-C  loratadine (CLARITIN) 10 MG tablet Take 10 mg by mouth daily.    [provider]  meloxicam (MOBIC) 15 MG tablet Take 15 mg by mouth daily. 03/08/22   [provider]  Multiple Vitamins-Minerals (ONE DAILY WOMENS) TABS Take 1 tablet by mouth daily.    [provider]  norethindrone (AYGESTIN) 5 MG tablet Take by mouth. 07/22/22   [provider]  TRI-SPRINTEC 0.18/0.215/0.25 MG-35 MCG tablet Take 1 tablet by mouth daily. 08/12/20   [provider]  Triamcinolone Acetonide 0.025 % LOTN as needed.  05/25/13   [provider]  Past Surgical History Past Surgical History:  Procedure Laterality Date   BREATH TEK H PYLORI N/A 12/23/2012   Procedure: BREATH TEK H PYLORI;  Surgeon: Shann Medal, MD;  Location: [facility];  Service: General;  Laterality: N/A;   BREATH TEK H PYLORI N/A 01/27/2013   Procedure: BREATH TEK H PYLORI;  Surgeon: Shann Medal, MD;  Location: [facility];  Service: General;  Laterality: N/A;   CESAREAN SECTION  12/2005   Twins   CYSTECTOMY  2004   Bronchialgenic cyst   DILATION AND CURETTAGE OF UTERUS  2008   LAPAROSCOPIC GASTRIC BANDING N/A 02/23/2013   Procedure: LAPAROSCOPIC GASTRIC BANDING;  Surgeon: Shann Medal, MD;  Location: [facility];  Service: General;  Laterality: N/A;  Lap Band   Family History Family History  Problem Relation Age of Onset   Heart disease Mother    Diabetes Mother     Heart disease Father    Alzheimer's disease Father    Cancer Brother        leukemia   Diabetes Brother    Cancer Maternal Uncle        lung   Cancer Paternal Aunt        breast    Social History Social History   Tobacco Use   Smoking status: Never   Smokeless tobacco: Never  Vaping Use   Vaping Use: Never used  Substance Use Topics   Alcohol use: Yes    Comment: socially   Drug use: No   Allergies Tobramycin  Review of Systems Review of Systems  Respiratory:  Positive for shortness of breath.   Cardiovascular:  Positive for chest pain.    Physical Exam Vital Signs  I have reviewed the triage vital signs BP 134/84   Pulse 73   Temp 98.2 F (36.8 C) (Oral)   Resp (!) 23   Ht '5\' 9"'$  (1.753 m)   Wt 106.1 kg   SpO2 100%   BMI 34.56 kg/m   Physical Exam Vitals and nursing note reviewed.  Constitutional:      General: She is not in acute distress.    Appearance: She is well-developed.  HENT:     Head: Normocephalic and atraumatic.  Eyes:     Conjunctiva/sclera: Conjunctivae normal.  Cardiovascular:     Rate and Rhythm: Normal rate and regular rhythm.     Heart sounds: No murmur heard. Pulmonary:     Effort: Pulmonary effort is normal. No respiratory distress.     Breath sounds: Normal breath sounds.  Abdominal:     Palpations: Abdomen is soft.     Tenderness: There is no abdominal tenderness.  Musculoskeletal:        General: No swelling.     Cervical back: Neck supple.  Skin:    General: Skin is warm and dry.     Capillary Refill: Capillary refill takes less than 2 seconds.  Neurological:     Mental Status: She is alert.  Psychiatric:        Mood and Affect: Mood normal.     ED Results and Treatments Labs (all labs ordered are listed, but only abnormal results are displayed) Labs Reviewed  D-DIMER, QUANTITATIVE - Abnormal; Notable for the following components:      Result Value   D-Dimer, Quant 0.77 (*)    All other components within  normal limits  COMPREHENSIVE METABOLIC PANEL  CBC WITH DIFFERENTIAL/PLATELET  HCG, SERUM, QUALITATIVE  TROPONIN I (HIGH SENSITIVITY)  Radiology CT Angio Chest PE W and/or Wo Contrast  Result Date: 08/08/2022 CLINICAL DATA:  History of AFib with left-sided chest pain EXAM: CT ANGIOGRAPHY CHEST WITH CONTRAST TECHNIQUE: Multidetector CT imaging of the chest was performed using the standard protocol during bolus administration of intravenous contrast. Multiplanar CT image reconstructions and MIPs were obtained to evaluate the vascular anatomy. RADIATION DOSE REDUCTION: This exam was performed according to the departmental dose-optimization program which includes automated exposure control, adjustment of the mA and/or kV according to patient size and/or use of iterative reconstruction technique. CONTRAST:  16m OMNIPAQUE IOHEXOL 350 MG/ML SOLN COMPARISON:  CT chest dated 09/28/2008, chest radiograph dated 08/08/2022 FINDINGS: Cardiovascular: The study is high quality for the evaluation of pulmonary embolism. There are no filling defects in the central, lobar, segmental or subsegmental pulmonary artery branches to suggest acute pulmonary embolism. Great vessels are normal in course and caliber. Normal heart size. Coronary artery calcifications. No significant pericardial fluid/thickening. 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 Mediastinum/Nodes: Partially imaged thyroid gland without clinically significant nodules. Small hiatal hernia. No pathologically enlarged axillary, supraclavicular, mediastinal, or hilar lymph nodes. Lungs/Pleura: The central airways are patent. No focal consolidation. No pneumothorax. Small loculated pleural effusion along the posteromedial right lower lobe. Upper abdomen: Horizontal orientation of the gastric  band is again seen with phi angle measuring approximately 62 degrees. Musculoskeletal: No acute or abnormal lytic or blastic osseous lesions. Review of the MIP images confirms the above findings. IMPRESSION: 1. No finding of pulmonary embolism. 2. Small loculated pleural effusion along the posteromedial right lower lobe. 3. Findings of gastric band slippage.  Small hiatal hernia. Electronically Signed   By: LDarrin NipperM.D.   On: 08/08/2022 10:55   DG Chest 2 View  Result Date: 08/08/2022 CLINICAL DATA:  Chest pain EXAM: CHEST - 2 VIEW COMPARISON:  09/28/2008 FINDINGS: Remote posterior right 6th rib defect could be postsurgical or less likely posttraumatic. Midline trachea. Normal heart size and mediastinal contours. No pleural effusion or pneumothorax. Numerous leads and wires project over the chest. Clear lungs. IMPRESSION: No active cardiopulmonary disease. Electronically Signed   By: KAbigail MiyamotoM.D.   On: 08/08/2022 08:22    Pertinent labs & imaging results that were available during my care of the patient were reviewed by me and considered in my medical decision making (see MDM for details).  Medications Ordered in ED Medications  iohexol (OMNIPAQUE) 350 MG/ML injection 100 mL (75 mLs Intravenous Contrast Given 08/08/22 1023)  ketorolac (TORADOL) 15 MG/ML injection 15 mg (15 mg Intravenous Given 08/08/22 1148)  alum & mag hydroxide-simeth (MAALOX/MYLANTA) 200-200-20 MG/5ML suspension 30 mL (30 mLs Oral Given 08/08/22 1258)    And  lidocaine (XYLOCAINE) 2 % viscous mouth solution 15 mL (15 mLs Oral Given 08/08/22 1258)  Procedures Procedures  (including critical care time)  Medical Decision Making / ED Course   This patient presents to the ED for concern of chest pain, this involves an extensive number of treatment options, and is a complaint that carries  with it a high risk of complications and morbidity.  The differential diagnosis includes gastritis, esophagitis, ACS, PE, pneumonia  MDM: Seen emergency room for evaluation of chest pain.  Physical exam largely unremarkable.  Laboratory evaluation unremarkable including high-sensitivity troponin.  And unfortunately unable to be perked out secondary to hormone use and a D-dimer was obtained that was elevated to 0.77.  CT PE was obtained that was negative for PE but does show a small loculated pleural effusion.  I consulted pulmonology to discuss this finding and this is so small, she would certainly be safe for follow-up in the pulmonology clinic of which Dr. Chase Caller will help set up follow-up.  On my reevaluation, patient states that she has been having increased aspiration events after receiving her gastric banding and I have higher suspicion that her symptoms may be secondary to esophagitis with reflux and the small pleural effusion may be secondary to aspiration.  She was given a GI cocktail and her symptoms significantly improved.  Patient encouraged to call her bariatric surgeon and will follow-up outpatient with pulmonology.  A delta troponin was not obtained because when we went to obtain this, the patient asked if we could defer this.  Her heart score is low and we do have a better explanation for her pain.  I explained the risk and benefits of possibly missing underlying ACS which she voiced understanding and we have shared decision making to not obtain additional troponin.  Patient then discharged with outpatient follow-up.   Additional history obtained:  -External records from outside source obtained and reviewed including: Chart review including previous notes, labs, imaging, consultation notes   Lab Tests: -I ordered, reviewed, and interpreted labs.   The pertinent results include:   Labs Reviewed  D-DIMER, QUANTITATIVE - Abnormal; Notable for the following components:      Result  Value   D-Dimer, Quant 0.77 (*)    All other components within normal limits  COMPREHENSIVE METABOLIC PANEL  CBC WITH DIFFERENTIAL/PLATELET  HCG, SERUM, QUALITATIVE  TROPONIN I (HIGH SENSITIVITY)      EKG   EKG Interpretation  Date/Time:  Thursday August 08 2022 07:53:07 EDT Ventricular Rate:  75 PR Interval:  145 QRS Duration: 94 QT Interval:  389 QTC Calculation: 435 R Axis:   -39 Text Interpretation: Sinus rhythm Left axis deviation Low voltage, precordial leads Confirmed by [place] ([id number]) on 08/08/2022 7:54:11 AM         Imaging Studies ordered: I ordered imaging studies including chest x-ray, CT PE I independently visualized and interpreted imaging. I agree with the radiologist interpretation   Medicines ordered and prescription drug management: Meds ordered this encounter  Medications   iohexol (OMNIPAQUE) 350 MG/ML injection 100 mL   ketorolac (TORADOL) 15 MG/ML injection 15 mg   AND Linked Order Group    alum & mag hydroxide-simeth (MAALOX/MYLANTA) 200-200-20 MG/5ML suspension 30 mL    lidocaine (XYLOCAINE) 2 % viscous mouth solution 15 mL   alum & mag hydroxide-simeth (MAALOX MAX) 400-400-40 MG/5ML suspension    Sig: Take 15 mLs by mouth every 6 (six) hours as needed for indigestion.    Dispense:  355 mL    Refill:  0    -I have reviewed the patients home medicines  and have made adjustments as needed  Critical interventions none  Consultations Obtained: I requested consultation with the pulmonologist Dr. Chase Caller,  and discussed lab and imaging findings as well as pertinent plan - they recommend: Outpatient follow-up   Cardiac Monitoring: The patient was maintained on a cardiac monitor.  I personally viewed and interpreted the cardiac monitored which showed an underlying rhythm of: NSR  Social Determinants of Health:  Factors impacting patients care include: none   Reevaluation: After the interventions noted above, I reevaluated the  patient and found that they have :improved  Co morbidities that complicate the patient evaluation  Past Medical History:  Diagnosis Date   Anemia    Dysrhythmia    HISTORY OF AFIB X 2 MONTHS 2004 - RESOLVED 1 MONTH POST BRONCHIOGENIC CYST REMOVAL   Environmental allergies    Heart murmur    History of atrial fibrillation    History of transfusion    Memory deficit    Migraine    Morbid obesity ([place])       Dispostion: I considered admission for this patient, but she currently does not meet inpatient criteria for admission and safe for discharge with outpatient follow-up     Final Clinical Impression(s) / ED Diagnoses Final diagnoses:  Pleural effusion  Chest pain, unspecified type     '@PCDICTATION'$ @    Teressa Lower, MD 08/08/22 1501

## 2022-08-09 NOTE — Telephone Encounter (Signed)
Called and spoke with patient, scheduled consult OV with Dr. Silas Flood for 11/1 at 8:30 am, advised to arrive by 8:15 am for check in.  She verified understanding.  Mailed her an appointment reminder with our address/phone # per her request after verifying her address.  Nothing further needed.

## 2022-08-28 ENCOUNTER — Other Ambulatory Visit: Payer: Self-pay

## 2022-08-28 ENCOUNTER — Encounter: Payer: Self-pay | Admitting: Pulmonary Disease

## 2022-08-28 ENCOUNTER — Ambulatory Visit: Payer: Commercial Managed Care - HMO | Admitting: Pulmonary Disease

## 2022-08-28 VITALS — BP 124/68 | HR 70 | Ht 70.0 in | Wt 242.2 lb

## 2022-08-28 DIAGNOSIS — J90 Pleural effusion, not elsewhere classified: Secondary | ICD-10-CM | POA: Diagnosis not present

## 2022-08-28 MED ORDER — SEMAGLUTIDE-WEIGHT MANAGEMENT 1 MG/0.5ML ~~LOC~~ SOAJ
1.0000 mg | SUBCUTANEOUS | 2 refills | Status: AC
  Filled 2022-08-28: qty 2, 28d supply, fill #0
  Filled 2022-10-22 (×2): qty 2, 28d supply, fill #1

## 2022-08-28 NOTE — Progress Notes (Signed)
$'@Patient'H$  ID: [name], female    DOB: 1976-04-08, 46 y.o.   MRN: [id number]  Chief Complaint  Patient presents with   Consult    Consult for pleural effusion. Pt went to ER about two weeks ago and was noted on CT scan. CT scan was 10/12. Pt states nothing was done in the hospital. No issues noted with breathing. No chest pain noted today     Referring provider: Gaynelle Arabian, MD  HPI:   46 y.o. woman whom are seen in consultation for evaluation of loculated right pleural effusion.  ED note 07/2022 reviewed.  Patient was usual state of health.  Developed left-sided chest pain.  Only blood pressure.  Went to ED.  There D-dimer was elevated.  This prompted CTA PE protocol.  This showed no evidence of PE, clear lungs on the left, clearing on the right, small lobulated loculated effusion posterior right lung adjacent to medial thorax/spine.  Too small and even if larger not amenable to intervention based on location.  Recommend outpatient follow-up.  She does have a history of Lap-Band surgery.  And has bad reflux.  Symptoms were quite frequently however in the last several weeks or months she has been sleeping propped up and changing her eating habits.  With this, her symptoms of reflux have markedly improved.  CT scan does demonstrate a hiatal hernia.  In the past, prior interventions, she had relatively frequent nighttime awakenings with coughing and choking fits.  Felt like she was aspirating on her reflux contents.  We discussed at length hiatal hernia and reflux and possible aspiration event that led to local inflammation and loculated effusion.  She did have a bronchogenic cyst removed in 2003.  She has a interval CT scan 2009 that shows no evidence of pleural effusion, clear lungs on my review and interpretation, that would link the 2.  PMH: Hypertension, seasonal allergies, GERD Surgical history: Lap band surgery Social history: Never smoker, lives in [place] Family history:  Reviewed, denies significant rest or illness in first-degree relatives  Questionaires / Pulmonary Flowsheets:   ACT:      No data to display          MMRC:     No data to display          Epworth:      No data to display          Tests:   FENO:  No results found for: "NITRICOXIDE"  PFT:     No data to display          WALK:      No data to display          Imaging: Personally reviewed and as per EMR discussion this note CT Angio Chest PE W and/or Wo Contrast  Result Date: 08/08/2022 CLINICAL DATA:  History of AFib with left-sided chest pain EXAM: CT ANGIOGRAPHY CHEST WITH CONTRAST TECHNIQUE: Multidetector CT imaging of the chest was performed using the standard protocol during bolus administration of intravenous contrast. Multiplanar CT image reconstructions and MIPs were obtained to evaluate the vascular anatomy. RADIATION DOSE REDUCTION: This exam was performed according to the departmental dose-optimization program which includes automated exposure control, adjustment of the mA and/or kV according to patient size and/or use of iterative reconstruction technique. CONTRAST:  69m OMNIPAQUE IOHEXOL 350 MG/ML SOLN COMPARISON:  CT chest dated 09/28/2008, chest radiograph dated 08/08/2022 FINDINGS: Cardiovascular: The study is high quality for the evaluation of pulmonary embolism. There are no filling  defects in the central, lobar, segmental or subsegmental pulmonary artery branches to suggest acute pulmonary embolism. Great vessels are normal in course and caliber. Normal heart size. Coronary artery calcifications. No significant pericardial fluid/thickening. 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 Mediastinum/Nodes: Partially imaged thyroid gland without clinically significant nodules. Small hiatal hernia. No pathologically enlarged axillary, supraclavicular, mediastinal, or hilar lymph nodes.  Lungs/Pleura: The central airways are patent. No focal consolidation. No pneumothorax. Small loculated pleural effusion along the posteromedial right lower lobe. Upper abdomen: Horizontal orientation of the gastric band is again seen with phi angle measuring approximately 62 degrees. Musculoskeletal: No acute or abnormal lytic or blastic osseous lesions. Review of the MIP images confirms the above findings. IMPRESSION: 1. No finding of pulmonary embolism. 2. Small loculated pleural effusion along the posteromedial right lower lobe. 3. Findings of gastric band slippage.  Small hiatal hernia. Electronically Signed   By: Darrin Nipper M.D.   On: 08/08/2022 10:55   DG Chest 2 View  Result Date: 08/08/2022 CLINICAL DATA:  Chest pain EXAM: CHEST - 2 VIEW COMPARISON:  09/28/2008 FINDINGS: Remote posterior right 6th rib defect could be postsurgical or less likely posttraumatic. Midline trachea. Normal heart size and mediastinal contours. No pleural effusion or pneumothorax. Numerous leads and wires project over the chest. Clear lungs. IMPRESSION: No active cardiopulmonary disease. Electronically Signed   By: Abigail Miyamoto M.D.   On: 08/08/2022 08:22    Lab Results:  CBC    Component Value Date/Time   WBC 6.3 08/08/2022 0759   RBC 4.73 08/08/2022 0759   HGB 12.8 08/08/2022 0759   HCT 39.9 08/08/2022 0759   PLT 231 08/08/2022 0759   MCV 84.4 08/08/2022 0759   MCH 27.1 08/08/2022 0759   MCHC 32.1 08/08/2022 0759   RDW 15.4 08/08/2022 0759   LYMPHSABS 1.3 08/08/2022 0759   MONOABS 0.5 08/08/2022 0759   EOSABS 0.1 08/08/2022 0759   BASOSABS 0.0 08/08/2022 0759    BMET    Component Value Date/Time   NA 140 08/08/2022 0759   K 4.2 08/08/2022 0759   CL 104 08/08/2022 0759   CO2 26 08/08/2022 0759   GLUCOSE 88 08/08/2022 0759   BUN 13 08/08/2022 0759   CREATININE 0.91 08/08/2022 0759   CREATININE 0.94 12/08/2012 1008   CALCIUM 9.5 08/08/2022 0759   GFRNONAA >60 08/08/2022 0759   GFRAA 83 (L)  02/16/2013 1010    BNP No results found for: "BNP"  ProBNP No results found for: "PROBNP"  Specialty Problems   None   Allergies  Allergen Reactions   Tobramycin Other (See Comments)    Swelling and redness     There is no immunization history on file for this patient.  Past Medical History:  Diagnosis Date   Anemia    Dysrhythmia    HISTORY OF AFIB X 2 MONTHS 2004 - RESOLVED 1 MONTH POST BRONCHIOGENIC CYST REMOVAL   Environmental allergies    Heart murmur    History of atrial fibrillation    History of transfusion    Memory deficit    Migraine    Morbid obesity ([place])     Tobacco History: Social History   Tobacco Use  Smoking Status Never  Smokeless Tobacco Never   Counseling given: Not Answered   Continue to not smoke  Outpatient Encounter Medications as of 08/28/2022  Medication Sig   amLODipine (NORVASC) 5 MG tablet Take 5 mg by mouth daily.   clobetasol (TEMOVATE)  0.05 % external solution Apply 1 application topically 2 (two) times daily.   hydroxychloroquine (PLAQUENIL) 200 MG tablet Take 1 tablet (200 mg total) by mouth 2 (two) times daily.   Multiple Vitamins-Minerals (ONE DAILY WOMENS) TABS Take 1 tablet by mouth daily.   Triamcinolone Acetonide 0.025 % LOTN as needed.    [DISCONTINUED] alum & mag hydroxide-simeth (MAALOX MAX) 400-400-40 MG/5ML suspension Take 15 mLs by mouth every 6 (six) hours as needed for indigestion.   [DISCONTINUED] AUROVELA FE 1/20 1-20 MG-MCG tablet Take by mouth.   [DISCONTINUED] loratadine (CLARITIN) 10 MG tablet Take 10 mg by mouth daily.   [DISCONTINUED] meloxicam (MOBIC) 15 MG tablet Take 15 mg by mouth daily.   [DISCONTINUED] norethindrone (AYGESTIN) 5 MG tablet Take by mouth.   [DISCONTINUED] TRI-SPRINTEC 0.18/0.215/0.25 MG-35 MCG tablet Take 1 tablet by mouth daily.   No facility-administered encounter medications on file as of 08/28/2022.     Review of Systems  Review of Systems  No chest pain.  No orthopnea  or PND.  Mild dyspnea exertion with exercise.  Comprehensive review systems otherwise negative. Physical Exam  BP 124/68 (BP Location: Right Arm, Patient Position: Sitting, Cuff Size: Normal)   Pulse 70   Ht '5\' 10"'$  (1.778 m)   Wt 242 lb 3.2 oz (109.9 kg)   SpO2 99%   BMI 34.75 kg/m   Wt Readings from Last 5 Encounters:  08/28/22 242 lb 3.2 oz (109.9 kg)  08/08/22 234 lb (106.1 kg)  03/11/22 251 lb (113.9 kg)  01/28/22 242 lb (109.8 kg)  09/27/20 260 lb (117.9 kg)    BMI Readings from Last 5 Encounters:  08/28/22 34.75 kg/m  08/08/22 34.56 kg/m  03/11/22 37.07 kg/m  01/28/22 35.74 kg/m  09/27/20 38.40 kg/m     Physical Exam General: Sitting in chair, no acute distress Eyes: EOMI, icterus Neck: Supple, no JVP Pulmonary: Clear, no work of breathing Cardiovascular: warm, no edema Abdomen: Ischemic bowel sounds present MSK: No synovitis, no joint effusion Neuro: Normal gait, no weakness Psych: No mood, full affect   Assessment & Plan:   Right-sided loculated pleural effusion: Very small, adjacent to the spine.  Looks distinct from postsurgical changes after bronchogenic cyst demonstrated on recent CT especially when compared to prior CT.  Not present on prior CT 2009.  Suspect may be local reaction to aspiration event given description of aspiration symptoms.  Repeat CT scan in 4 weeks to ensure no worsening, which would likely indicate ongoing infection.  She has no infectious symptoms no symptoms whatsoever on that side.  If not resolved or improved, plan repeat CT scan scan in about 6 months for interval follow-up.  GERD: Severe in the past.  Lifestyle modifications has improved in terms of symptoms.  Still at risk for aspiration and reflux symptoms at night particularly in the setting of hiatal hernia.  Encouraged ongoing lifestyle modifications.  Consider referral to GI if needed if symptoms worsen.  Return in about 6 months (around 02/26/2023).   Lanier Clam, MD 08/28/2022

## 2022-08-28 NOTE — Patient Instructions (Signed)
We will plan to repeat a CT scan at the end of the month in November  If not improved or resolved, plan to repeat a CT scan in about 6 months  Return to clinic in 6 months or sooner as needed, if area has totally gone away on CT scan we can plan to cancel follow-up.

## 2022-08-29 ENCOUNTER — Other Ambulatory Visit: Payer: Self-pay

## 2022-09-03 ENCOUNTER — Other Ambulatory Visit: Payer: Self-pay

## 2022-09-26 ENCOUNTER — Ambulatory Visit
Admission: RE | Admit: 2022-09-26 | Discharge: 2022-09-26 | Disposition: A | Discharge status: Home / Self Care | Payer: Commercial Managed Care - HMO | Source: Ambulatory Visit | Attending: Pulmonary Disease | Admitting: Pulmonary Disease

## 2022-09-26 DIAGNOSIS — J90 Pleural effusion, not elsewhere classified: Secondary | ICD-10-CM

## 2022-09-30 ENCOUNTER — Other Ambulatory Visit: Payer: Self-pay

## 2022-09-30 DIAGNOSIS — J90 Pleural effusion, not elsewhere classified: Secondary | ICD-10-CM

## 2022-09-30 NOTE — Progress Notes (Signed)
CT scan is stable - suspect the changes are post surgical. Would recommend a f/u scan in 6 months and OV with me after to discuss results if ok with [name].

## 2022-10-22 ENCOUNTER — Other Ambulatory Visit: Payer: Self-pay

## 2022-10-23 ENCOUNTER — Other Ambulatory Visit: Payer: Self-pay

## 2023-03-04 DIAGNOSIS — I10 Essential (primary) hypertension: Secondary | ICD-10-CM | POA: Diagnosis not present

## 2023-03-04 DIAGNOSIS — R600 Localized edema: Secondary | ICD-10-CM | POA: Diagnosis not present

## 2023-03-04 DIAGNOSIS — E669 Obesity, unspecified: Secondary | ICD-10-CM | POA: Diagnosis not present

## 2023-03-04 DIAGNOSIS — Z6833 Body mass index (BMI) 33.0-33.9, adult: Secondary | ICD-10-CM | POA: Diagnosis not present

## 2023-03-04 DIAGNOSIS — Z881 Allergy status to other antibiotic agents status: Secondary | ICD-10-CM | POA: Diagnosis not present

## 2023-04-29 ENCOUNTER — Ambulatory Visit
Admission: RE | Admit: 2023-04-29 | Discharge: 2023-04-29 | Disposition: A | Discharge status: Home / Self Care | Payer: 59 | Source: Ambulatory Visit | Attending: Pulmonary Disease | Admitting: Pulmonary Disease

## 2023-04-29 DIAGNOSIS — K449 Diaphragmatic hernia without obstruction or gangrene: Secondary | ICD-10-CM | POA: Diagnosis not present

## 2023-04-29 DIAGNOSIS — J90 Pleural effusion, not elsewhere classified: Secondary | ICD-10-CM | POA: Diagnosis not present

## 2023-04-29 DIAGNOSIS — I2510 Atherosclerotic heart disease of native coronary artery without angina pectoris: Secondary | ICD-10-CM | POA: Diagnosis not present

## 2023-04-29 DIAGNOSIS — I700 Atherosclerosis of aorta: Secondary | ICD-10-CM | POA: Diagnosis not present

## 2023-05-06 NOTE — Progress Notes (Signed)
CT scan is stable, can discuss further at upcoming office visit.

## 2023-05-09 DIAGNOSIS — I10 Essential (primary) hypertension: Secondary | ICD-10-CM | POA: Diagnosis not present

## 2023-05-09 DIAGNOSIS — E669 Obesity, unspecified: Secondary | ICD-10-CM | POA: Diagnosis not present

## 2023-05-09 DIAGNOSIS — I2510 Atherosclerotic heart disease of native coronary artery without angina pectoris: Secondary | ICD-10-CM | POA: Diagnosis not present

## 2023-05-09 DIAGNOSIS — Z13220 Encounter for screening for lipoid disorders: Secondary | ICD-10-CM | POA: Diagnosis not present

## 2023-05-09 DIAGNOSIS — Z79899 Other long term (current) drug therapy: Secondary | ICD-10-CM | POA: Diagnosis not present

## 2023-05-09 DIAGNOSIS — L669 Cicatricial alopecia, unspecified: Secondary | ICD-10-CM | POA: Diagnosis not present

## 2023-05-09 DIAGNOSIS — Z0000 Encounter for general adult medical examination without abnormal findings: Secondary | ICD-10-CM | POA: Diagnosis not present

## 2023-05-09 DIAGNOSIS — R609 Edema, unspecified: Secondary | ICD-10-CM | POA: Diagnosis not present

## 2023-05-09 DIAGNOSIS — Z1211 Encounter for screening for malignant neoplasm of colon: Secondary | ICD-10-CM | POA: Diagnosis not present

## 2023-05-13 ENCOUNTER — Telehealth: Payer: Self-pay | Admitting: Pulmonary Disease

## 2023-05-28 DIAGNOSIS — Z01419 Encounter for gynecological examination (general) (routine) without abnormal findings: Secondary | ICD-10-CM | POA: Diagnosis not present

## 2023-05-28 DIAGNOSIS — Z1231 Encounter for screening mammogram for malignant neoplasm of breast: Secondary | ICD-10-CM | POA: Diagnosis not present

## 2023-05-28 DIAGNOSIS — Z124 Encounter for screening for malignant neoplasm of cervix: Secondary | ICD-10-CM | POA: Diagnosis not present

## 2023-05-28 DIAGNOSIS — Z113 Encounter for screening for infections with a predominantly sexual mode of transmission: Secondary | ICD-10-CM | POA: Diagnosis not present

## 2023-05-28 DIAGNOSIS — Z01411 Encounter for gynecological examination (general) (routine) with abnormal findings: Secondary | ICD-10-CM | POA: Diagnosis not present

## 2023-07-14 NOTE — Progress Notes (Unsigned)
Cardiology Office Note:  .   Date:  07/14/2023  ID:  [name], DOB [birth year], MRN [id number] PCP: Blair Heys, MD  [facility] Providers Cardiologist:  None { Click to update primary MD,subspecialty MD or APP then REFRESH:1}   History of Present Illness: .   [name] is a 47 y.o. female with hx of obesity, s/p lap band, paroxysmal afib***, removal of a bronchogenic cyst referral from his PCP Dr. Manus Gunning for MV CAC    Family hx ***  Noted to have afib in 2004 post bronchogenic cystectomy.  ROS: ***  Studies Reviewed: .        *** Risk Assessment/Calculations:    CHA2DS2-VASc Score = 2  {Click here to calculate score.  REFRESH note before signing. :1} This indicates a 2.2% annual risk of stroke. The patient's score is based upon: CHF History: 0 HTN History: 1 Diabetes History: 0 Stroke History: 0 Vascular Disease History: 0 Age Score: 0 Gender Score: 1     No BP recorded.  {Refresh Note OR Click here to enter BP  :1}***       Physical Exam:   VS:  *** Wt Readings from Last 3 Encounters:  08/28/22 242 lb 3.2 oz (109.9 kg)  08/08/22 234 lb (106.1 kg)  03/11/22 251 lb (113.9 kg)    GEN: Well nourished, well developed in no acute distress NECK: No JVD; No carotid bruits CARDIAC: ***RRR, no murmurs, rubs, gallops RESPIRATORY:  Clear to auscultation without rales, wheezing or rhonchi  ABDOMEN: Soft, non-tender, non-distended EXTREMITIES:  No edema; No deformity   ASSESSMENT AND PLAN: .   MV CAC - asymptomatic ; seen on non gated chest CT - start asa 81 mg daily - start lipitor 40 mg daily - Fasting lipids    {Are you ordering a CV Procedure (e.g. stress test, cath, DCCV, TEE, etc)?   Press F2        :[id number]}  Dispo: ***  Signed, Maisie Fus, MD

## 2023-07-15 ENCOUNTER — Encounter: Payer: Self-pay | Admitting: Internal Medicine

## 2023-07-15 ENCOUNTER — Ambulatory Visit: Payer: 59 | Attending: Internal Medicine | Admitting: Internal Medicine

## 2023-07-15 VITALS — BP 132/88 | HR 76 | Ht 69.0 in | Wt 230.4 lb

## 2023-07-15 DIAGNOSIS — I2510 Atherosclerotic heart disease of native coronary artery without angina pectoris: Secondary | ICD-10-CM | POA: Diagnosis not present

## 2023-07-15 NOTE — Patient Instructions (Signed)
Medication Instructions:   No changes     Lab Work:  Not needed   Testing/Procedures: Not needed   Follow-Up: At [facility], you and your health needs are our priority.  As part of our continuing mission to provide you with exceptional heart care, we have created designated Provider Care Teams.  These Care Teams include your primary Cardiologist (physician) and Advanced Practice Providers (APPs -  Physician Assistants and Nurse Practitioners) who all work together to provide you with the care you need, when you need it.     Your next appointment:   6 month(s)  The format for your next appointment:   In Person  Provider:   Maisie Fus, MD

## 2023-10-20 ENCOUNTER — Encounter: Payer: Self-pay | Admitting: Internal Medicine

## 2023-10-20 ENCOUNTER — Telehealth: Payer: Self-pay | Admitting: Internal Medicine

## 2023-10-20 NOTE — Telephone Encounter (Signed)
Pt c/o medication issue:  1. Name of Medication: amLODipine (NORVASC) 5 MG tablet   2. How are you currently taking this medication (dosage and times per day)?   Take 5 mg by mouth daily.    3. Are you having a reaction (difficulty breathing--STAT)? No  4. What is your medication issue? Pt states that medication is causing her to become fatigue as well as have blurred vision. Pt stated that she stopped taking medication 3 days ago. Please advise

## 2023-10-20 NOTE — Telephone Encounter (Signed)
Called and spoke to patient. Verified name and DOB. Patient stated she started taking Amlodipine 5 mg  in July and also immediately started having fatigue. She also started having blurred vision later on but did not contribute it to the Amlodipine. She switched the time of day she was taking it from morning to evening to see if it would help. She said that did not help with the fatigue or blurred vision. She stopped taking it 3 days ago and stated she feels much better. She deny symptoms at this time. She wants to stop the medication but didn't know if should be prescribed something else or try a lower dose of the Amlodipine.  Please advise.

## 2023-10-28 MED ORDER — CHLORTHALIDONE 25 MG PO TABS: 25.0000 mg | ORAL_TABLET | Freq: Every day | ORAL | 3 refills | Status: DC

## 2023-10-28 NOTE — Telephone Encounter (Signed)
 Alvan Ronal BRAVO, MD  Alto Race, LPN8 days ago   MB Hi Race, please let Mrs. Warnke know that she can stop her norvasc and switch to chlorthalidone  25 mg daily.  Please prescribe this for her. We can see how she does with this medication.  Called the patient and went over the information above. Informed her that: Chlorthalidone  is a diuretic medication that treats high blood pressure. It also treats swelling caused by heart, kidney or liver disease. It works by helping your kidneys remove fluid and salt from your blood through your pee  Sent in prescription to preferred pharmacy. Pt verbalized understanding of all information

## 2023-11-06 DIAGNOSIS — L089 Local infection of the skin and subcutaneous tissue, unspecified: Secondary | ICD-10-CM | POA: Diagnosis not present

## 2023-11-06 DIAGNOSIS — Z5181 Encounter for therapeutic drug level monitoring: Secondary | ICD-10-CM | POA: Diagnosis not present

## 2023-11-06 DIAGNOSIS — L6681 Central centrifugal cicatricial alopecia: Secondary | ICD-10-CM | POA: Diagnosis not present

## 2023-11-07 ENCOUNTER — Encounter: Payer: Self-pay | Admitting: Internal Medicine

## 2023-12-25 DIAGNOSIS — N898 Other specified noninflammatory disorders of vagina: Secondary | ICD-10-CM | POA: Diagnosis not present

## 2024-01-14 ENCOUNTER — Ambulatory Visit: Payer: 59 | Admitting: Internal Medicine

## 2024-01-21 ENCOUNTER — Ambulatory Visit: Attending: Internal Medicine | Admitting: Internal Medicine

## 2024-01-21 ENCOUNTER — Encounter: Payer: Self-pay | Admitting: Internal Medicine

## 2024-01-21 VITALS — BP 114/68 | HR 90 | Ht 69.0 in | Wt 227.4 lb

## 2024-01-21 DIAGNOSIS — I10 Essential (primary) hypertension: Secondary | ICD-10-CM

## 2024-01-21 MED ORDER — CHLORTHALIDONE 25 MG PO TABS: 25.0000 mg | ORAL_TABLET | Freq: Every day | ORAL | 1 refills | Status: AC

## 2024-01-21 NOTE — Patient Instructions (Signed)
 Medication Instructions:  Your physician recommends that you continue on your current medications as directed. Please refer to the Current Medication list given to you today.  *If you need a refill on your cardiac medications before your next appointment, please call your pharmacy*   Follow-Up: At [facility], you and your health needs are our priority.  As part of our continuing mission to provide you with exceptional heart care, we have created designated Provider Care Teams.  These Care Teams include your primary Cardiologist (physician) and Advanced Practice Providers (APPs -  Physician Assistants and Nurse Practitioners) who all work together to provide you with the care you need, when you need it.   Your next appointment:   6 month(s)  Provider:   Dr. Servando Salina  Other Instructions

## 2024-01-21 NOTE — Progress Notes (Signed)
  Cardiology Office Note:  .   Date:  01/21/2024  ID:  [name], DOB 1976/09/12, MRN [id number] PCP: Blair Heys, MD (Inactive)  [facility] Providers Cardiologist:  Maisie Fus, MD    History of Present Illness: .   [name] is a 48 y.o. female with hx of obesity, HTN,  s/p lap band, paroxysmal afib in 2004, removal of a bronchogenic cyst referral from his PCP Dr. Manus Gunning for multi-vessel CAC  Noted to have afib in 2004 post bronchogenic cystectomy. 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  She notes she had afib for 3 months. She converted to sinus rhythm on her own.  She has not had a recurrence  She notes some stress and CP associated.   For her HTN, she started on amlodipine. She notes elevated ambulatory readings. She goes to the fire station to have her Bp checked   She denies angina, dyspnea on exertion, lower extremity edema, PND or orthopnea.  No palpitations  Mother had hx of heart dx. Mother and father have HTN  She is inactive.   ROS:  per HPI otherwise negative  Interim hx 01/21/2024 She is doing well today. She denies SOB or chest pain. She is taking chlorthalidone. She denies palpitations. She denies syncope. No lightheadedness   Studies Reviewed: Marland Kitchen        EKG Interpretation Date/Time:    Ventricular Rate:    PR Interval:    QRS Duration:    QT Interval:    QTC Calculation:   R Axis:      Text Interpretation:    Risk Assessment/Calculations:    CHA2DS2-VASc Score = 2   This indicates a 2.2% annual risk of stroke. The patient's score is based upon: CHF History: 0 HTN History: 1 Diabetes History: 0 Stroke History: 0 Vascular Disease History: 0 Age Score: 0 Gender Score: 1         Physical Exam:   VS:  There were no vitals filed for this visit.   Wt Readings from Last 3 Encounters:  07/15/23 230 lb 6.4 oz (104.5 kg)  08/28/22 242 lb 3.2 oz (109.9 kg)  08/08/22 234 lb (106.1 kg)    GEN:  Well nourished, well developed in no acute distress NECK: No JVD; No carotid bruits CARDIAC: RRR, SEM, rubs, gallops RESPIRATORY:  Clear to auscultation without rales, wheezing or rhonchi  ABDOMEN: Soft, non-tender, non-distended EXTREMITIES:  No edema; No deformity   ASSESSMENT AND PLAN: .   Multivessel disease/ CAC - asymptomatic ; seen on non gated chest CT - she chose  to hold off on asa 81 mg daily and lipitor 40 mg daily - TC 214 mg/dL LDL 213 mg/dL, HDL 79 mg/dL 0/86/5784    HTN Well-controlled Stopped norvasc 5 mg , for ? fatigue continue chlorthalidone - recommend continued ambulatory monitoring  PAF: documented after a removal of a bronchogenic cyst in 2004. CHADS2VASC is low and have not seen a recurrence. Continue to monitor. No indication for AC  SEM: murmur. Notes uncle had a valve replacement under 60. She had echoes in 2004, no valve disease.   Dispo: Follow up in 6 months with Dr. Servando Salina  Signed, Glenn Gullickson, Alben Spittle, MD

## 2024-03-01 DIAGNOSIS — R600 Localized edema: Secondary | ICD-10-CM | POA: Diagnosis not present

## 2024-03-01 DIAGNOSIS — Z881 Allergy status to other antibiotic agents status: Secondary | ICD-10-CM | POA: Diagnosis not present

## 2024-03-01 DIAGNOSIS — Z823 Family history of stroke: Secondary | ICD-10-CM | POA: Diagnosis not present

## 2024-03-01 DIAGNOSIS — I4891 Unspecified atrial fibrillation: Secondary | ICD-10-CM | POA: Diagnosis not present

## 2024-03-01 DIAGNOSIS — Z6831 Body mass index (BMI) 31.0-31.9, adult: Secondary | ICD-10-CM | POA: Diagnosis not present

## 2024-03-01 DIAGNOSIS — Z809 Family history of malignant neoplasm, unspecified: Secondary | ICD-10-CM | POA: Diagnosis not present

## 2024-03-01 DIAGNOSIS — I10 Essential (primary) hypertension: Secondary | ICD-10-CM | POA: Diagnosis not present

## 2024-03-01 DIAGNOSIS — E669 Obesity, unspecified: Secondary | ICD-10-CM | POA: Diagnosis not present

## 2024-03-01 DIAGNOSIS — H02729 Madarosis of unspecified eye, unspecified eyelid and periocular area: Secondary | ICD-10-CM | POA: Diagnosis not present

## 2024-03-01 DIAGNOSIS — Z8249 Family history of ischemic heart disease and other diseases of the circulatory system: Secondary | ICD-10-CM | POA: Diagnosis not present

## 2024-03-01 DIAGNOSIS — Z833 Family history of diabetes mellitus: Secondary | ICD-10-CM | POA: Diagnosis not present

## 2024-03-01 DIAGNOSIS — L659 Nonscarring hair loss, unspecified: Secondary | ICD-10-CM | POA: Diagnosis not present

## 2024-04-21 DIAGNOSIS — N952 Postmenopausal atrophic vaginitis: Secondary | ICD-10-CM | POA: Diagnosis not present

## 2024-04-21 DIAGNOSIS — N898 Other specified noninflammatory disorders of vagina: Secondary | ICD-10-CM | POA: Diagnosis not present

## 2024-04-21 DIAGNOSIS — B3731 Acute candidiasis of vulva and vagina: Secondary | ICD-10-CM | POA: Diagnosis not present

## 2024-04-21 DIAGNOSIS — N760 Acute vaginitis: Secondary | ICD-10-CM | POA: Diagnosis not present

## 2024-05-11 DIAGNOSIS — Z1211 Encounter for screening for malignant neoplasm of colon: Secondary | ICD-10-CM | POA: Diagnosis not present

## 2024-05-11 DIAGNOSIS — R0683 Snoring: Secondary | ICD-10-CM | POA: Diagnosis not present

## 2024-05-11 DIAGNOSIS — I10 Essential (primary) hypertension: Secondary | ICD-10-CM | POA: Diagnosis not present

## 2024-05-11 DIAGNOSIS — Z0000 Encounter for general adult medical examination without abnormal findings: Secondary | ICD-10-CM | POA: Diagnosis not present

## 2024-05-11 DIAGNOSIS — E782 Mixed hyperlipidemia: Secondary | ICD-10-CM | POA: Diagnosis not present

## 2024-05-17 DIAGNOSIS — R0683 Snoring: Secondary | ICD-10-CM | POA: Diagnosis not present

## 2024-05-17 DIAGNOSIS — G2581 Restless legs syndrome: Secondary | ICD-10-CM | POA: Diagnosis not present

## 2024-05-17 DIAGNOSIS — I10 Essential (primary) hypertension: Secondary | ICD-10-CM | POA: Diagnosis not present

## 2024-05-17 DIAGNOSIS — R7303 Prediabetes: Secondary | ICD-10-CM | POA: Diagnosis not present

## 2024-05-17 DIAGNOSIS — G4719 Other hypersomnia: Secondary | ICD-10-CM | POA: Diagnosis not present

## 2024-05-17 DIAGNOSIS — E669 Obesity, unspecified: Secondary | ICD-10-CM | POA: Diagnosis not present

## 2024-06-09 DIAGNOSIS — K5900 Constipation, unspecified: Secondary | ICD-10-CM | POA: Diagnosis not present

## 2024-06-09 DIAGNOSIS — K219 Gastro-esophageal reflux disease without esophagitis: Secondary | ICD-10-CM | POA: Diagnosis not present

## 2024-06-09 DIAGNOSIS — Z9884 Bariatric surgery status: Secondary | ICD-10-CM | POA: Diagnosis not present

## 2024-06-09 DIAGNOSIS — Z1211 Encounter for screening for malignant neoplasm of colon: Secondary | ICD-10-CM | POA: Diagnosis not present

## 2024-06-10 DIAGNOSIS — L6681 Central centrifugal cicatricial alopecia: Secondary | ICD-10-CM | POA: Diagnosis not present

## 2024-06-10 DIAGNOSIS — Z5181 Encounter for therapeutic drug level monitoring: Secondary | ICD-10-CM | POA: Diagnosis not present

## 2024-06-10 DIAGNOSIS — L089 Local infection of the skin and subcutaneous tissue, unspecified: Secondary | ICD-10-CM | POA: Diagnosis not present

## 2024-06-21 DIAGNOSIS — R0683 Snoring: Secondary | ICD-10-CM | POA: Diagnosis not present

## 2024-06-29 DIAGNOSIS — Z83719 Family history of colon polyps, unspecified: Secondary | ICD-10-CM | POA: Diagnosis not present

## 2024-06-29 DIAGNOSIS — Z1211 Encounter for screening for malignant neoplasm of colon: Secondary | ICD-10-CM | POA: Diagnosis not present

## 2024-06-30 DIAGNOSIS — R7303 Prediabetes: Secondary | ICD-10-CM | POA: Diagnosis not present

## 2024-06-30 DIAGNOSIS — G2581 Restless legs syndrome: Secondary | ICD-10-CM | POA: Diagnosis not present

## 2024-06-30 DIAGNOSIS — R0683 Snoring: Secondary | ICD-10-CM | POA: Diagnosis not present

## 2024-06-30 DIAGNOSIS — I10 Essential (primary) hypertension: Secondary | ICD-10-CM | POA: Diagnosis not present

## 2024-06-30 DIAGNOSIS — F5104 Psychophysiologic insomnia: Secondary | ICD-10-CM | POA: Diagnosis not present

## 2024-07-09 ENCOUNTER — Encounter: Payer: Self-pay | Admitting: Cardiology

## 2024-07-14 DIAGNOSIS — Z1331 Encounter for screening for depression: Secondary | ICD-10-CM | POA: Diagnosis not present

## 2024-07-14 DIAGNOSIS — Z1231 Encounter for screening mammogram for malignant neoplasm of breast: Secondary | ICD-10-CM | POA: Diagnosis not present

## 2024-07-14 DIAGNOSIS — Z01419 Encounter for gynecological examination (general) (routine) without abnormal findings: Secondary | ICD-10-CM | POA: Diagnosis not present
